# Patient Record
Sex: Male | Born: 1990 | Race: Black or African American | Hispanic: No | Marital: Single | Smoking: Never smoker
Health system: Southern US, Community
[De-identification: ages and names within clinical notes are randomized; demographics above are authoritative.]

## PROBLEM LIST (undated history)

## (undated) DIAGNOSIS — J45909 Unspecified asthma, uncomplicated: Secondary | ICD-10-CM

## (undated) DIAGNOSIS — G4730 Sleep apnea, unspecified: Secondary | ICD-10-CM

## (undated) HISTORY — DX: Unspecified asthma, uncomplicated: J45.909

## (undated) HISTORY — DX: Sleep apnea, unspecified: G47.30

## (undated) HISTORY — PX: TONSILLECTOMY: SUR1361

---

## 2016-06-15 ENCOUNTER — Encounter: Payer: Self-pay | Admitting: Emergency Medicine

## 2016-06-15 ENCOUNTER — Emergency Department
Admission: EM | Admit: 2016-06-15 | Discharge: 2016-06-15 | Disposition: A | Discharge status: Home / Self Care | Payer: Managed Care, Other (non HMO) | Attending: Emergency Medicine | Admitting: Emergency Medicine

## 2016-06-15 DIAGNOSIS — H6092 Unspecified otitis externa, left ear: Secondary | ICD-10-CM | POA: Diagnosis not present

## 2016-06-15 DIAGNOSIS — H9202 Otalgia, left ear: Secondary | ICD-10-CM | POA: Diagnosis present

## 2016-06-15 DIAGNOSIS — H65193 Other acute nonsuppurative otitis media, bilateral: Secondary | ICD-10-CM | POA: Diagnosis not present

## 2016-06-15 DIAGNOSIS — J0100 Acute maxillary sinusitis, unspecified: Secondary | ICD-10-CM | POA: Insufficient documentation

## 2016-06-15 DIAGNOSIS — H65113 Acute and subacute allergic otitis media (mucoid) (sanguinous) (serous), bilateral: Secondary | ICD-10-CM

## 2016-06-15 MED ORDER — NEOMYCIN-POLYMYXIN-HC 3.5-10000-1 OT SOLN: 3.0000 [drp] | Freq: Three times a day (TID) | OTIC | 0 refills | Status: AC

## 2016-06-15 MED ORDER — NEOMYCIN-POLYMYXIN-HC 1 % OT SOLN
4.0000 [drp] | Freq: Once | OTIC | Status: AC
  Administered 2016-06-15: 4 [drp] via OTIC
  Filled 2016-06-15: qty 10

## 2016-06-15 MED ORDER — AMOXICILLIN-POT CLAVULANATE 875-125 MG PO TABS: 1.0000 | ORAL_TABLET | Freq: Two times a day (BID) | ORAL | 0 refills | Status: DC

## 2016-06-15 MED ORDER — FLUTICASONE PROPIONATE 50 MCG/ACT NA SUSP: 1.0000 | Freq: Two times a day (BID) | NASAL | 0 refills | Status: DC

## 2016-06-15 NOTE — ED Provider Notes (Signed)
[facility] Emergency Department Provider Note  ____________________________________________  Time seen: Approximately 10:52 PM  I have reviewed the triage vital signs and the nursing notes.   HISTORY  Chief Complaint Facial Pain and Otalgia    HPI Steve Love is a 25 y.o. male who presents emergency department complaining of left ear pain, and left-sided facial pain. Patient states that he has had swimmers ear symptoms for 9 days. Patient recently moved to the area, has not developed many friends, has been spending time after work relaxing in a [employer] reading a book. Patient reports that he has gotten water in his ears and developed Economist. Patient has had this complaint in the past and states that symptoms were the same. Patient was using over-the-counter swimmer's ear medication, using ear wick for symptom relief. Patient states initially that this was helpful but states that pain has increased over the last several days. Patient is also complaining of facial pressure/pain. Patient states that the symptoms have been ongoing times several days. Patient denies any nasal congestion, visual changes, sore throat, chest pain, shortness of breath, cough, sneezing. No other complaints at this time.   History reviewed. No pertinent past medical history.  There are no active problems to display for this patient.   History reviewed. No pertinent surgical history.  Prior to Admission medications   Medication Sig Start Date End Date Taking? Authorizing Provider  amoxicillin-clavulanate (AUGMENTIN) 875-125 MG tablet Take 1 tablet by mouth 2 (two) times daily. 06/15/16   Delorise Royals Sindi Beckworth, PA-C  fluticasone (FLONASE) 50 MCG/ACT nasal spray Place 1 spray into both nostrils 2 (two) times daily. 06/15/16   Delorise Royals Phares Zaccone, PA-C  neomycin-polymyxin-hydrocortisone (CORTISPORIN) otic solution Place 3 drops into both ears 3 (three) times daily. 06/15/16 06/25/16   Christiane Ha D Traycen Goyer, PA-C    Allergies Review of patient's allergies indicates no known allergies.  History reviewed. No pertinent family history.  Social History Social History  Substance Use Topics  . Smoking status: Never Smoker  . Smokeless tobacco: Never Used  . Alcohol use No     Review of Systems  Constitutional: No fever/chills Eyes: No visual changes. No discharge ENT: Positive for left ear pain. Positive for facial pressure. Cardiovascular: no chest pain. Respiratory: no cough. No SOB. Gastrointestinal: No abdominal pain.  No nausea, no vomiting.  Musculoskeletal: Negative for musculoskeletal pain. Skin: Negative for rash, abrasions, lacerations, ecchymosis. Neurological: Negative for headaches, focal weakness or numbness. 10-point ROS otherwise negative.  ____________________________________________   PHYSICAL EXAM:  VITAL SIGNS: ED Triage Vitals  Enc Vitals Group     BP 06/15/16 2124 (!) 152/82     Pulse Rate 06/15/16 2124 86     Resp 06/15/16 2124 18     Temp 06/15/16 2124 98.6 F (37 C)     Temp Source 06/15/16 2124 Oral     SpO2 06/15/16 2124 97 %     Weight 06/15/16 2125 (!) 360 lb (163.3 kg)     Height 06/15/16 2125 6\' 3"  (1.905 m)     Head Circumference --      Peak Flow --      Pain Score 06/15/16 2125 8     Pain Loc --      Pain Edu? --      Excl. in GC? --      Constitutional: Alert and oriented. Well appearing and in no acute distress. Eyes: Conjunctivae are normal. PERRL. EOMI. Head: Atraumatic. ENT:  Ears: EACs on right is unremarkable. TM is mildly bulging with mucoid air-fluid level. EAC on left is erythematous and edematous, TM is dusky, bulging, air-fluid level identified.      Nose: No congestion/rhinnorhea. Turbinates are boggy. Patient is tender to percussion over left-sided maxillary sinuses.      Mouth/Throat: Mucous membranes are moist.  Neck: No stridor.   Hematological/Lymphatic/Immunilogical: No cervical  lymphadenopathy. Cardiovascular: Normal rate, regular rhythm. Normal S1 and S2.  Good peripheral circulation. Respiratory: Normal respiratory effort without tachypnea or retractions. Lungs CTAB. Good air entry to the bases with no decreased or absent breath sounds. Musculoskeletal: Full range of motion to all extremities. No gross deformities appreciated. Neurologic:  Normal speech and language. No gross focal neurologic deficits are appreciated.  Skin:  Skin is warm, dry and intact. No rash noted. Psychiatric: Mood and affect are normal. Speech and behavior are normal. Patient exhibits appropriate insight and judgement.   ____________________________________________   LABS (all labs ordered are listed, but only abnormal results are displayed)  Labs Reviewed - No data to display ____________________________________________  EKG   ____________________________________________  RADIOLOGY   No results found.  ____________________________________________    PROCEDURES  Procedure(s) performed:    Procedures    Medications  NEOMYCIN-POLYMYXIN-HYDROCORTISONE (CORTISPORIN) otic solution 4 drop (not administered)     ____________________________________________   INITIAL IMPRESSION / ASSESSMENT AND PLAN / ED COURSE  Pertinent labs & imaging results that were available during my care of the patient were reviewed by me and considered in my medical decision making (see chart for details).  Review of the [place] CSRS was performed in accordance of the [facility] prior to dispensing any controlled drugs.  Clinical Course    Patient's diagnosis is consistent with Left-sided otitis externa, bilateral mucoid otitis media, maxillary sinusitis. Patient will be discharged home with prescriptions for antibiotic eardrops, oral antibiotics, Flonase. Patient is to follow up with primary care provider as needed or otherwise directed. Patient is given ED precautions to return to the ED for any  worsening or new symptoms.     ____________________________________________  FINAL CLINICAL IMPRESSION(S) / ED DIAGNOSES  Final diagnoses:  Otitis externa, left  Acute mucoid otitis media of both ears  Acute maxillary sinusitis, recurrence not specified      NEW MEDICATIONS STARTED DURING THIS VISIT:  New Prescriptions   AMOXICILLIN-CLAVULANATE (AUGMENTIN) 875-125 MG TABLET    Take 1 tablet by mouth 2 (two) times daily.   FLUTICASONE (FLONASE) 50 MCG/ACT NASAL SPRAY    Place 1 spray into both nostrils 2 (two) times daily.   NEOMYCIN-POLYMYXIN-HYDROCORTISONE (CORTISPORIN) OTIC SOLUTION    Place 3 drops into both ears 3 (three) times daily.        This chart was dictated using voice recognition software/Dragon. Despite best efforts to proofread, errors can occur which can change the meaning. Any change was purely unintentional.    Racheal PatchesJonathan D Nkechi Linehan, PA-C 06/15/16 2315    Phineas SemenGraydon Goodman, MD 06/15/16 [phone]

## 2016-06-15 NOTE — ED Triage Notes (Signed)
Pt reports pain to left ear spreading into left side of face x 8 days, reports feels like fluid is in ear, hearing muffled.  Pt ambulatory, NAD noted, resp equal and unlabored, skin warm and dry.

## 2016-06-15 NOTE — ED Notes (Signed)
Pt in via triage with complaints of left ear and facial pain.  Pt reports he has had swimmers ear x 7 days, using OTC medicine to "drain ear."  Pt reports pain began today with puss like drainage.  Pt A/Ox4, no immediate distress noted.

## 2018-05-11 ENCOUNTER — Other Ambulatory Visit: Payer: Self-pay | Admitting: General Surgery

## 2018-05-16 ENCOUNTER — Ambulatory Visit
Admission: RE | Admit: 2018-05-16 | Discharge: 2018-05-16 | Disposition: A | Discharge status: Home / Self Care | Payer: Managed Care, Other (non HMO) | Source: Ambulatory Visit | Attending: General Surgery | Admitting: General Surgery

## 2018-05-16 DIAGNOSIS — G4730 Sleep apnea, unspecified: Secondary | ICD-10-CM | POA: Diagnosis present

## 2018-05-16 DIAGNOSIS — Z01810 Encounter for preprocedural cardiovascular examination: Secondary | ICD-10-CM | POA: Insufficient documentation

## 2018-05-31 ENCOUNTER — Encounter: Payer: Managed Care, Other (non HMO) | Attending: General Surgery | Admitting: Skilled Nursing Facility1

## 2018-05-31 ENCOUNTER — Encounter: Payer: Self-pay | Admitting: Skilled Nursing Facility1

## 2018-05-31 DIAGNOSIS — Z713 Dietary counseling and surveillance: Secondary | ICD-10-CM | POA: Diagnosis present

## 2018-05-31 DIAGNOSIS — E669 Obesity, unspecified: Secondary | ICD-10-CM

## 2018-05-31 DIAGNOSIS — Z6841 Body Mass Index (BMI) 40.0 and over, adult: Secondary | ICD-10-CM | POA: Diagnosis not present

## 2018-05-31 NOTE — Progress Notes (Signed)
Pre-Op Assessment Visit:  Pre-Operative Sleeve Surgery  Medical Nutrition Therapy:  Appt start time: 8:03  End time:  9:00  Patient was seen on 05/31/2018 for Pre-Operative Nutrition Assessment. Assessment and [facility] approval [facility] coordinator on 05/31/2018.   According to pts referral: Needs 3 SWL.  Pt states he struggles with emotional eating; Pt states he used to work with a therapist that is how he realized he was an Surveyor, quantityemotional eater and has controlled it with other activities instead of eating. Pt states he takes naps and stays up all night. Pt states he tried keto but realized it was depriving him of vital nutrients. Pt states he does not want to live in [place] and is anxious to move back closer to his very supportive mother.  Current Activity 2-3 times a week walking 2 miles Pt is ready for surgery mentally/emotionally.  Goals: -Sleep 6 hours every night -only 4 meals of fast food a week using your meal ideas sheet  Pt expectation of surgery: to lose weight  Pt expectation of Dietitian: none stated  Start weight at [facility]: 477.3 BMI: 64.72  24 hr Dietary Recall: First Meal: protein shake Snack: Second Meal: fast food Snack: Third Meal: fast food Snack:  Beverages: water, sweet tea, soda  Encouraged to engage in 150 minutes of moderate physical activity including cardiovascular and weight baring weekly  Handouts given during visit include:  . Pre-Op Goals . Bariatric Surgery Protein Shakes During the appointment today the following Pre-Op Goals were reviewed with the patient: . Maintain or lose weight as instructed by your surgeon . Make healthy food choices . Begin to limit portion sizes . Limited concentrated sugars and fried foods . Keep fat/sugar in the single digits per serving on             food labels . Practice CHEWING your food  (aim for 30 chews per bite or until applesauce consistency) . Practice  not drinking 15 minutes before, during, and 30 minutes after each meal/snack . Avoid all carbonated beverages  . Avoid/limit caffeinated beverages  . Avoid all sugar-sweetened beverages . Consume 3 meals per day; eat every 3-5 hours . Make a list of non-food related activities . Aim for 64-100 ounces of FLUID daily  . Aim for at least 60-80 grams of PROTEIN daily . Look for a liquid protein source that contain ?15 g protein and ?5 g carbohydrate  (ex: shakes, drinks, shots)  -Follow diet recommendations listed below   Energy and Macronutrient Recomendations: Calories: 1600 Carbohydrate: 180 Protein: 120 Fat: 44  Demonstrated degree of understanding via:  Teach Back  Teaching Method Utilized:  Visual Auditory Hands on  Barriers to learning/adherence to lifestyle change: none identified   Patient to call the Nutrition and Diabetes Education Services to enroll in Pre-Op and Post-Op Nutrition Education when surgery date is scheduled.

## 2018-06-28 ENCOUNTER — Encounter: Payer: Self-pay | Admitting: Neurology

## 2018-07-03 ENCOUNTER — Encounter: Payer: Self-pay | Admitting: Neurology

## 2018-07-03 ENCOUNTER — Ambulatory Visit: Payer: Managed Care, Other (non HMO) | Admitting: Neurology

## 2018-07-03 ENCOUNTER — Encounter: Payer: Managed Care, Other (non HMO) | Attending: General Surgery | Admitting: Skilled Nursing Facility1

## 2018-07-03 ENCOUNTER — Encounter: Payer: Self-pay | Admitting: Skilled Nursing Facility1

## 2018-07-03 VITALS — BP 157/107 | HR 76 | Ht 74.0 in | Wt >= 6400 oz

## 2018-07-03 DIAGNOSIS — R0682 Tachypnea, not elsewhere classified: Secondary | ICD-10-CM | POA: Diagnosis not present

## 2018-07-03 DIAGNOSIS — Z713 Dietary counseling and surveillance: Secondary | ICD-10-CM | POA: Insufficient documentation

## 2018-07-03 DIAGNOSIS — E662 Morbid (severe) obesity with alveolar hypoventilation: Secondary | ICD-10-CM | POA: Diagnosis not present

## 2018-07-03 DIAGNOSIS — R0602 Shortness of breath: Secondary | ICD-10-CM

## 2018-07-03 DIAGNOSIS — E669 Obesity, unspecified: Secondary | ICD-10-CM | POA: Diagnosis not present

## 2018-07-03 DIAGNOSIS — G4733 Obstructive sleep apnea (adult) (pediatric): Secondary | ICD-10-CM

## 2018-07-03 DIAGNOSIS — Z6841 Body Mass Index (BMI) 40.0 and over, adult: Secondary | ICD-10-CM | POA: Insufficient documentation

## 2018-07-03 DIAGNOSIS — E66813 Obesity, class 3: Secondary | ICD-10-CM | POA: Insufficient documentation

## 2018-07-03 NOTE — Progress Notes (Signed)
Sleeve Assessment:  1st SWL Appointment.  Pt is ready for surgery mentally/emotionally.  Pt arrives having gained about 4 pounds. Pt states he has been drinking more water and is having dreams so sleeping better stating he feels drinking sodas caused him to toss and turn at night. Pt states he has less eczema flare ups since drinking less soda. Pt states he has drank some alcohol to celebrate his birthday.  Pt states he was recognizing that could transition to a problem to he stopped drinking heavily. Pt states he still needs to eat more at home but has made better food choices. Pt states since sleeping better he has more energy and not taking cat naps. Pt states he got veggies to snack on on the way to [place]. Pt states he has been reading more too.   Start weight at [facility]: 477.3 Wt: 481 BMI: 65.24  MEDICATIONS: See List   DIETARY INTAKE:  24-hr recall:  B ( AM): 2-3 eggs with Malaw[place]urkey sausage 2 and 1 slice of wheat toast Snk ( AM):  L ( PM): salad or bunless burger  Snk ( PM):  D ( PM): 3 eggs and waffle or Malaw[place]urkey and cheese sandwich or subway Snk ( PM): sometimes a pecan roll Beverages: water, mixed drinks, coffee, applesauce   Usual physical activity: 2-3 times a week walking 2 miles  Diet to Follow: 2000 calories 225 g carbohydrates 150 g protein 56 g fat   Nutritional Diagnosis:  [place]-3.3 Overweight/obesity related to past poor dietary habits and physical inactivity as evidenced by patient w/ planned sleeve surgery following dietary guidelines for continued weight loss.    Intervention:  Nutrition counseling for upcoming Bariatric Surgery. Goals: -Encouraged to engage in 150 minutes of moderate physical activity including cardiovascular and weight baring weekly -4 days a week for 30 minute of cardio and 30 minutes of weights  -Work on not drinking with meals   Teaching Method Utilized:  Visual Auditory Hands on   Barriers to learning/adherence to lifestyle change:  none stated  Demonstrated degree of understanding via:  Teach Back   Monitoring/Evaluation:  Dietary intake, exercise, and body weight prn.

## 2018-07-03 NOTE — Progress Notes (Addendum)
SLEEP MEDICINE CLINIC   Provider:  Melvyn Novas, M.D.   Primary Care Physician:     Referring Provider: Kinsinger, De Blanch, *   Chief Complaint  Patient presents with  . New Patient (Initial Visit)    pt alone, rm 10. pt is worked up by bariatric surgeon dr. Sheliah Hatch to have gasric sleeve surgery. He has a history of sleep apnea diagnosed 2011/ 2012 inby HST mail ordered to him in [facility] with pulmonology . pt states he was diagnosed with poor ventilation and apnea, started a CPAP machine with [facility] -and stopped the machine roughly 4 years ago. pt snores again and its not as bad as used to.    HPI:  Steve Love is a 27 y.o. male patient with a history of superobeisty- he is seen on 07-03-2018 upon referral  from Dr. Sheliah Hatch for pre op evaluation.   I have the pleasure of meeting with [name] today, a 27 year old African-American gentleman with a history of obstructive sleep apnea, diagnosed in the year 2011 or 2012 in [place], followed by treatment with a CPAP machine.  He moved to [facility] 2017.  At the time he had not used his CPAP machine probably for 2 to 3 years.  He did acknowledge that he felt he was sleeping better, and thought he would no longer needed as much.  But he is snoring again. Steve Love report that his mother also has obstructive sleep apnea and is treated with CPAP.   Chief complaint according to patient : The patient is working with a nutritionist in preparation for his bariatric surgery.  He reports that some mornings he will not go to bed before 3 or 4 AM, but he does not feel significantly sleepy or fatigued.  Sleep habits are as follows: The patient may return from work as early as 5 and as late as 8 PM, dinnertime is usually around an hour after returning home.  His bedtime also may vary but is often after midnight.  He keeps his bedroom cool, quiet and dark.  He prefers to sleep on his sides, on a lot of pillows.  It is more  comfortable when the chest and head are elevated.  He reports that he dreams frequently.  Does not have nocturia but usually has to go at 6 AM.  He is usually able to sleep 5 to 6 hours through the night..  Some mornings he will feel a little sleepy but in general he has no trouble getting up in the morning, there is no dizziness or lightheadedness, palpitations or diaphoresis, headaches and no dry mouth. He has nobody witnessing his sleep, snoring  Or apnea.  He takes naps 3-4 hours long- these interfere with his sleep at night- he would go to bed right after work and wake up 4 hours later , then couldn't sleep through the night.   Sleep medical history and family sleep history:  mother has OSA, twin siblings do not.  Super obesity - after 16 , changes in activity level, eating fast food, high carb.   Social history:  Soda and sweet iced tea drinker, coffee , too.  ETOH - social - at parties.  Tobacco non sure.   Review of Systems: Out of a complete 14 system review, the patient complains of only the following symptoms, and all other reviewed systems are negative.   Epworth score 6/ 24  , Fatigue severity score 15/ 63   , depression score :  Social History   Socioeconomic History  . Marital status: Single    Spouse name: Not on file  . Number of children: Not on file  . Years of education: Not on file  . Highest education level: Not on file  Occupational History  . Not on file  Social Needs  . Financial resource strain: Not on file  . Food insecurity:    Worry: Not on file    Inability: Not on file  . Transportation needs:    Medical: Not on file    Non-medical: Not on file  Tobacco Use  . Smoking status: Never Smoker  . Smokeless tobacco: Never Used  Substance and Sexual Activity  . Alcohol use: Never    Frequency: Never  . Drug use: No  . Sexual activity: Not on file  Lifestyle  . Physical activity:    Days per week: Not on file    Minutes per session: Not on file    . Stress: Not on file  Relationships  . Social connections:    Talks on phone: Not on file    Gets together: Not on file    Attends religious service: Not on file    Active member of club or organization: Not on file    Attends meetings of clubs or organizations: Not on file    Relationship status: Not on file  . Intimate partner violence:    Fear of current or ex partner: Not on file    Emotionally abused: Not on file    Physically abused: Not on file    Forced sexual activity: Not on file  Other Topics Concern  . Not on file  Social History Narrative  . Not on file    Family History  Problem Relation Age of Onset  . Cancer Other   . Hypertension Mother   . Prostate cancer Father     Past Medical History:  Diagnosis Date  . Asthma   . Sleep apnea     Past Surgical History:  Procedure Laterality Date  . TONSILLECTOMY      No current outpatient medications on file.   No current facility-administered medications for this visit.     Allergies as of 07/03/2018  . (No Known Allergies)    Vitals: BP (!) 157/107   Pulse 76   Ht 6\' 2"  (1.88 m)   Wt (!) 485 lb (220 kg)   BMI 62.27 kg/m  Last Weight:  Wt Readings from Last 1 Encounters:  07/03/18 (!) 485 lb (220 kg)   UJW:JXBJBMI:Body mass index is 62.27 kg/m.     Last Height:   Ht Readings from Last 1 Encounters:  07/03/18 6\' 2"  (1.88 m)  respiratory rate is 19/min at rest.   Physical exam: SOB without exertion,   General: The patient is awake, alert and appears not in acute distress. The patient is well groomed. Head: Normocephalic, atraumatic. Neck is supple. Mallampati 5- invisible uvula ,   neck circumference:21.25" . Nasal airflow patent , Retrognathia is seen.  Cardiovascular:  Regular rate and rhythm , without  murmurs or carotid bruit, and without distended neck veins. Respiratory: Lungs are clear to auscultation. Skin:  Without evidence of edema, or rash Trunk: BMI is 62.27. The patient's posture is  erect.  Neurologic exam : The patient is drowsy, oriented to place and time.   Memory subjective described as intact. Attention span & concentration ability appears normal.  Speech is fluent,  without dysarthria, dysphonia or aphasia.  Mood and affect are appropriate.  Cranial nerves: Pupils are equal and briskly reactive to light.  Extraocular movements  in vertical and horizontal planes intact and without nystagmus. Visual Pelly by finger perimetry are intact.Hearing to finger rub intact. Facial sensation intact to fine touch.Facial motor strength is symmetric and tongue moves in  midline. Shoulder shrug was symmetrical.   Motor exam:  Normal tone, muscle bulk and symmetric strength in all extremities. Sensory:  Fine touch, pinprick and vibration were normal. Coordination: Rapid alternating movements normal without evidence of ataxia, dysmetria or tremor. Gait and station: Patient walks without assistive device and wide based . Deep tendon reflexes: in the  upper and lower extremities are symmetric and intact. Babinski maneuver response is downgoing.  Assessment:  After physical and neurologic examination, review of laboratory studies,  Personal review of imaging studies, reports of other /same  Imaging studies, results of polysomnography and / or neurophysiology testing and pre-existing records as far as provided in visit., my assessment is   1) Mr. 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    It is remarkable that he does not wake up with headaches, or a dry mouth.  He is not excessively daytime sleepy or fatigued.  Since he has already been diagnosed with obstructive sleep apnea in the past and was treated with CPAP I feel that it is enough to read demonstrate that apnea is still present and  then begin therapy.  I would prefer an attended sleep study split-night polysomnography during the baseline study of this protocol I would like to have capnography obtained.  If this is not possible or if Marily Memos does not approve of an attended sleep study we will have to resume using a home sleep test. I will order both tests today.  I will ask the sleep lab manager to make sure that if an attended sleep study is permitted, capnography will be used on this young man . If he is prolonged hypoxemic we may also titrate to oxygen.  I wish him the best of luck for his upcoming weight loss surgery.   I will send a copy of this report to Dr. Sheliah Hatch.  The patient was advised of the nature of the diagnosed disorder , the treatment options and the  risks for general health and wellness arising from not treating the condition.   I spent more than 45 minutes of face to face time with the patient.  Greater than 50% of time was spent in counseling and coordination of care. We have discussed the diagnosis and differential and I answered the patient's questions.    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  Needs capnography, may be oxygen titration.   SPLIT , Co2 , o2 and wedge for patient .    Melvyn Novas, MD 07/03/2018, 11:40 AM  Certified in Neurology by ABPN Certified in Sleep Medicine by [facility] [address], [address] [place], [place] [address]

## 2018-07-16 ENCOUNTER — Telehealth: Payer: Self-pay

## 2018-07-16 DIAGNOSIS — R0602 Shortness of breath: Secondary | ICD-10-CM

## 2018-07-16 DIAGNOSIS — Z6841 Body Mass Index (BMI) 40.0 and over, adult: Principal | ICD-10-CM

## 2018-07-16 DIAGNOSIS — E662 Morbid (severe) obesity with alveolar hypoventilation: Secondary | ICD-10-CM

## 2018-07-16 DIAGNOSIS — G4733 Obstructive sleep apnea (adult) (pediatric): Secondary | ICD-10-CM

## 2018-07-16 DIAGNOSIS — R0682 Tachypnea, not elsewhere classified: Secondary | ICD-10-CM

## 2018-07-16 NOTE — Telephone Encounter (Signed)
Insurance has denied the in lab sleep study request. Do you want to order a HST?

## 2018-07-17 NOTE — Telephone Encounter (Signed)
insurance related change to HST.

## 2018-07-17 NOTE — Addendum Note (Signed)
Addended by: Melvyn Novas on: 07/17/2018 05:13 PM   Modules accepted: Orders

## 2018-08-02 ENCOUNTER — Encounter: Payer: Self-pay | Admitting: Skilled Nursing Facility1

## 2018-08-02 ENCOUNTER — Encounter: Payer: Managed Care, Other (non HMO) | Attending: General Surgery | Admitting: Skilled Nursing Facility1

## 2018-08-02 DIAGNOSIS — Z6841 Body Mass Index (BMI) 40.0 and over, adult: Secondary | ICD-10-CM | POA: Insufficient documentation

## 2018-08-02 DIAGNOSIS — Z713 Dietary counseling and surveillance: Secondary | ICD-10-CM | POA: Diagnosis present

## 2018-08-02 DIAGNOSIS — E662 Morbid (severe) obesity with alveolar hypoventilation: Secondary | ICD-10-CM

## 2018-08-02 NOTE — Progress Notes (Signed)
Sleeve Assessment: 2nd SWL Appointment.  Pt is ready for surgery mentally/emotionally.  Pt arrives having lost about 1 pound. Pt states he has to find something that curves his appetite stating he is snacking all the time. Pt states he has been good about not over eating. Pt states he is trying to get a C-PAP machine.    Start weight at [facility]: 477.3 Wt: 480 BMI: 65.21  MEDICATIONS: See List   DIETARY INTAKE:  24-hr recall:  B ( AM): cereal or poptart  Snk ( AM): granola bar or protein shake L ( PM): 3 chicken legs and broccoli sometimes with mac n cheese Snk ( PM): fruit D ( PM): chicken legs and vegetables  Snk ( PM): protein shake  Beverages: water, green tea, protein shakes  Usual physical activity: 4-5 days a Week cardio and weight lifting   Diet to Follow: 2000 calories 225 g carbohydrates 150 g protein 56 g fat   Nutritional Diagnosis:  [place]-3.3 Overweight/obesity related to past poor dietary habits and physical inactivity as evidenced by patient w/ planned sleeve surgery following dietary guidelines for continued weight loss.    Intervention:  Nutrition counseling for upcoming Bariatric Surgery. Goals: -Encouraged to engage in 150 minutes of moderate physical activity including cardiovascular and weight baring weekly -Work on making balanced breakfasts with protein -Find the stress leading to the snacking and then deal with that stress more appropriately like walking -Continue to Work on not drinking with meals   Teaching Method Utilized:  Visual Auditory Hands on   Barriers to learning/adherence to lifestyle change: none stated  Demonstrated degree of understanding via:  Teach Back   Monitoring/Evaluation:  Dietary intake, exercise, and body weight prn.

## 2018-08-29 ENCOUNTER — Ambulatory Visit (INDEPENDENT_AMBULATORY_CARE_PROVIDER_SITE_OTHER): Payer: 59 | Admitting: Neurology

## 2018-08-29 DIAGNOSIS — R0602 Shortness of breath: Secondary | ICD-10-CM

## 2018-08-29 DIAGNOSIS — E66813 Obesity, class 3: Secondary | ICD-10-CM

## 2018-08-29 DIAGNOSIS — G4733 Obstructive sleep apnea (adult) (pediatric): Secondary | ICD-10-CM | POA: Diagnosis not present

## 2018-08-29 DIAGNOSIS — R0682 Tachypnea, not elsewhere classified: Secondary | ICD-10-CM

## 2018-08-29 DIAGNOSIS — Z6841 Body Mass Index (BMI) 40.0 and over, adult: Principal | ICD-10-CM

## 2018-08-29 DIAGNOSIS — E662 Morbid (severe) obesity with alveolar hypoventilation: Secondary | ICD-10-CM

## 2018-09-03 ENCOUNTER — Encounter: Payer: 59 | Attending: General Surgery | Admitting: Skilled Nursing Facility1

## 2018-09-03 ENCOUNTER — Encounter: Payer: Self-pay | Admitting: Skilled Nursing Facility1

## 2018-09-03 DIAGNOSIS — Z6841 Body Mass Index (BMI) 40.0 and over, adult: Secondary | ICD-10-CM | POA: Insufficient documentation

## 2018-09-03 DIAGNOSIS — E662 Morbid (severe) obesity with alveolar hypoventilation: Secondary | ICD-10-CM

## 2018-09-03 DIAGNOSIS — Z713 Dietary counseling and surveillance: Secondary | ICD-10-CM | POA: Diagnosis not present

## 2018-09-03 NOTE — Progress Notes (Signed)
Sleeve Assessment: 3rd SWL Appointment.  Pt is ready for surgery mentally/emotionally.   Pt arrives having gained about 3 pounds. Pt states he has noticed with not drinking with meals he is getting fuller faster. Pt states he dd eat out more this month because of stress at work. Pt states he is working on preparing his meals for the upcoming holiday season. Pt states he has been going to the gym more often. Pt states he has done better with recognizing he is emotionally eating. Pt states his urine stays clear and his skin feels softer since cutting back on sugar. Pt states she has only had 1 soda a day. Pt states since drinking more water and working out more he no longer has back spasms.   Start weight at [facility]: 477.3 Wt: 483 BMI: 65.59  MEDICATIONS: See List   DIETARY INTAKE:  24-hr recall:  B ( AM): egg and toast Snk ( AM): granola bar or protein shake L ( PM): [place] sandwich or peanut butter jelly sandwich Snk ( PM): fruit D ( PM): grilled chicken sandwich with mac n cheese or side salad  Snk ( PM): cereal or protein shakes with pureed frosted flakes  Beverages: water, green tea, protein shakes  Usual physical activity: 4-5 days a Week cardio and weight lifting   Diet to Follow: 2000 calories 225 g carbohydrates 150 g protein 56 g fat   Nutritional Diagnosis:  [place]-3.3 Overweight/obesity related to past poor dietary habits and physical inactivity as evidenced by patient w/ planned sleeve surgery following dietary guidelines for continued weight loss.    Intervention:  Nutrition counseling for upcoming Bariatric Surgery. Goals: -Encouraged to engage in 150 minutes of moderate physical activity including cardiovascular and weight baring weekly -Work on making balanced breakfasts with protein -Find the stress leading to the snacking and then deal with that stress more appropriately like walking -Continue to Work on not drinking with meals  -prepare your meals for the holiday  rush  Teaching Method Utilized:  Visual Auditory Hands on   Barriers to learning/adherence to lifestyle change: none stated  Demonstrated degree of understanding via:  Teach Back   Monitoring/Evaluation:  Dietary intake, exercise, and body weight prn.

## 2018-09-03 NOTE — Progress Notes (Signed)
[id number] 

## 2018-09-07 NOTE — Procedures (Signed)
NAME:   Steve Love                                                                   DOB: 1991/06/01 MEDICAL RECORD No:  [id number]                                           DOS:  08/30/2018 REFERRING PHYSICIAN: Rodman PickleLuke Aaron Kinsinger, MD STUDY PERFORMED: Home Sleep Test on Apnea Link HISTORY: Steve Love is a 27 y.o. male patient with a history of super-obesity- and is seen on 07-03-2018 upon referral from Dr. Sheliah HatchKinsinger for pre op evaluation.   I have the pleasure of meeting with Steve Love today, a 27 year old African-American gentleman with a history of obstructive sleep apnea, diagnosed in the year 2011 or 2012 in [place], followed by treatment with a CPAP machine.  [place] in [place] had no longer used his CPAP machine probably for 2 to 3 years.  He did acknowledge that he felt he was sleeping better, and thought he would no longer needed as much sleep -but now he is snoring again. He reports that some mornings he will not go to bed before 3 or 4 AM, but he does not feel significantly sleepy or fatigued. Orthopnea . Epworth Sleepiness score endorsed at 6/ 24 points, the Fatigue severity score at 15/ 63 points , BMI is 62.2 kg/m2.      STUDY RESULTS:  Total Recording Time: 6 h 52 min, valid flow evaluation: 6 h 11min Total Apnea/Hypopnea Index (AHI):  7.6 /h; RDI: 11.7 /h Average Oxygen Saturation:   94 %; Lowest Oxygen Saturation: 82 %.  Total Time Oxygen Saturation below 89 %: 6.0 minutes.  Average Heart Rate:  80 bpm (between 64 and 126 bpm). IMPRESSION: Surprisingly very mild Obstructive Sleep Apnea (AHI of only 7.6/h)  by Apnea Link. His desaturation index was higher, at 13.4/h and is more believable to reflect apneas and hypopneas correctly. No prolonged oxygen desaturation was noted in this patient with orthopnea. Moderate- loud snoring is noted.  RECOMMENDATION:  CPAP or a dental device can be used for mild sleep apnea, however at the  current BMI there is potentially retention of Co2 and shallow breathing with hypoventilation present, which is not easily differentiated by this HST device. I would recommend an attended CPAP treatment with capnography to evaluate hypoventilation.  An autotitration device will then adjust pressures accordingly to changes in BMI.   I certify that I have reviewed the raw data recording prior to the issuance of this report in accordance with the standards of the American Academy of Sleep Medicine (AASM). Melvyn Novasarmen Tashyra Adduci, M.D.   09-06-2018    Medical Director of [facility], accredited by the AASM. Diplomat of the ABPN and ABSM.

## 2018-09-07 NOTE — Addendum Note (Signed)
Addended by: Melvyn NovasHMEIER, Zaydan Papesh on: 09/07/2018 11:32 AM   Modules accepted: Orders

## 2018-09-10 ENCOUNTER — Telehealth: Payer: Self-pay | Admitting: Neurology

## 2018-09-10 NOTE — Telephone Encounter (Signed)
-----   Message from Melvyn Novasarmen Dohmeier, MD sent at 09/07/2018 11:32 AM EST ----- IMPRESSION: Surprisingly very mild Obstructive Sleep Apnea (AHI  of only 7.6/h) by Apnea Link. His desaturation index was higher,  at 13.4/h and is more believable to reflect apneas and hypopneas  correctly. No prolonged oxygen desaturation was noted in this patient with orthopnea. Moderate- loud snoring is noted.  RECOMMENDATION: CPAP or a dental device can be used for mild sleep apnea, however at the current BMI there is potentially  retention of Co2 and shallow breathing with hypoventilation present, which is not easily differentiated by this HST device. I  would recommend an attended CPAP treatment with capnography to evaluate hypoventilation. An autotitration device will then adjust pressures accordingly to changes in BMI.

## 2018-09-10 NOTE — Telephone Encounter (Signed)
Called the patient and advised the patient that the sleep study was read by Dr Vickey Hugerohmeier and found that he had mild sleep apnea. Dr Vickey Hugerohmeier isnt quite sure the accuracy of the test and she is wanting to bring the patient in so that we can monitor CO2 levels.  Dr. Vickey Hugerohmeier recommends that pt return for a repeat sleep study in order to properly titrate the cpap and ensure a good mask fit. Pt is agreeable to returning for a titration study. I advised pt that our sleep lab will file with pt's insurance and call pt to schedule the sleep study when we hear back from the pt's insurance regarding coverage of this sleep study. Pt verbalized understanding of results. Pt had no questions at this time but was encouraged to call back if questions arise.

## 2018-10-01 ENCOUNTER — Encounter: Payer: 59 | Attending: General Surgery | Admitting: Skilled Nursing Facility1

## 2018-10-01 ENCOUNTER — Encounter: Payer: Self-pay | Admitting: Skilled Nursing Facility1

## 2018-10-01 DIAGNOSIS — Z713 Dietary counseling and surveillance: Secondary | ICD-10-CM | POA: Insufficient documentation

## 2018-10-01 DIAGNOSIS — E662 Morbid (severe) obesity with alveolar hypoventilation: Secondary | ICD-10-CM

## 2018-10-01 DIAGNOSIS — Z6841 Body Mass Index (BMI) 40.0 and over, adult: Secondary | ICD-10-CM | POA: Insufficient documentation

## 2018-10-01 NOTE — Progress Notes (Signed)
Pre-Operative Nutrition Class:  Appt start time: 5051   End time:  1830.  Patient was seen on 10/01/2018 for Pre-Operative Bariatric Surgery Education at [facility].   Surgery date:  Surgery type: sleeve Start weight at [facility]: 477.3 Weight today: 488.1  Samples given per MNT protocol. Patient educated on appropriate usage: Bariatric Advantage Multivitamin Lot #  [id number] Exp: 4/21  Bariatric Advantage Calcium  Lot # [id number] Exp: 05/11/2019  Renee Pain Protein  Shake Lot # [id number] Exp: 04/25/19  Bariatric advantage: Lot: [id number]: 03/21  Protein 20: Lot: [id number]: 11/11/19   The following the learning objectives were met by the patient during this course:  Identify Pre-Op Dietary Goals and will begin 2 weeks pre-operatively  Identify appropriate sources of fluids and proteins   State protein recommendations and appropriate sources pre and post-operatively  Identify Post-Operative Dietary Goals and will follow for 2 weeks post-operatively  Identify appropriate multivitamin and calcium sources  Describe the need for physical activity post-operatively and will follow MD recommendations  State when to call healthcare provider regarding medication questions or post-operative complications  Handouts given during class include:  Pre-Op Bariatric Surgery Diet Handout  Protein Shake Handout  Post-Op Bariatric Surgery Nutrition Handout  BELT Program Information Flyer  Support Group Information Flyer  [facility] Bariatric Supplements Price List  Follow[facility]: [facility] per MD.

## 2018-10-02 ENCOUNTER — Telehealth: Payer: Self-pay | Admitting: Skilled Nursing Facility1

## 2018-10-02 NOTE — Telephone Encounter (Signed)
Dietitian called pt to let him know Lorene DyChristie with [facility] states he needs to get back to his original weight and show consistency with his weight for his insurance company to cover his surgery.   Pt states he will call back to make an appt and try to lose weight in the mean time.

## 2018-10-05 NOTE — Telephone Encounter (Signed)
Pt will call [facility] to set up an appt.

## 2019-08-22 ENCOUNTER — Other Ambulatory Visit: Payer: Self-pay

## 2019-08-22 ENCOUNTER — Encounter: Payer: 59 | Attending: General Surgery | Admitting: Dietician

## 2019-08-22 ENCOUNTER — Encounter: Payer: Self-pay | Admitting: Dietician

## 2019-08-22 VITALS — Ht 72.0 in | Wt >= 6400 oz

## 2019-08-22 DIAGNOSIS — J45909 Unspecified asthma, uncomplicated: Secondary | ICD-10-CM | POA: Insufficient documentation

## 2019-08-22 DIAGNOSIS — Z6841 Body Mass Index (BMI) 40.0 and over, adult: Secondary | ICD-10-CM | POA: Insufficient documentation

## 2019-08-22 DIAGNOSIS — Z713 Dietary counseling and surveillance: Secondary | ICD-10-CM | POA: Insufficient documentation

## 2019-08-22 DIAGNOSIS — E66813 Obesity, class 3: Secondary | ICD-10-CM

## 2019-08-22 DIAGNOSIS — G4730 Sleep apnea, unspecified: Secondary | ICD-10-CM | POA: Diagnosis not present

## 2019-08-22 NOTE — Patient Instructions (Signed)
   Plan time to shop for healthy food options to prepare and eat more meals at home.   Great job making healthy diet changes! Keep up the awesome work!

## 2019-08-22 NOTE — Progress Notes (Signed)
Nutrition Assessment/ Weight management     Height: 6'2" Weight: 465.2lbs BMI: 63.09 Upper IBW% (UIBW): 222% (209lbs)  Patient's Goal Weight: <350lbs  Medical History: asthma, sleep apnea Medications and Supplements: no medications at this time  Previous surgeries: tonsillectomy Drug allergies: none known Food allergies: none known Alcohol use: none currently  Tobacco use: never  Physical activity: walking 25 minutes 2-3x a week  Dieting/ weight loss history:   Patient reports making diet and lifestyle changes in recent months to promote weight loss; he has lost about 25lbs in the past year.   He has been eating fewer restaurant meals and making healthier choices at restaurants; has significantly reduced intake of sugar-sweetened beverages. He has increased some physical activity.   He reports his highest weight was about 540lbs several years ago; meeting with therapist to get control over emotional eating (binge eating) helped him begin losing weight.  Family and most friends live 2-3 hours' drive away but do provide positive support for patient.   Dietary Recall:  Daily pattern: 2-3 meals and 1 snacks. Dining out: multiple meals per week. Breakfast: often none due to sleeping late; coffee, sometimes peanut butter jelly sandwich Lunch: sub sandwich; [employer] restaurant food (close to work) Supper: salad with grilled meat; chick-fila Snack(s): PBJ or [place] sandwich if hungry (used to eat fast food) Beverages: water, juice (planning to decrease), coffee, soda 3-4 per week (down from 3-4 per day)   Intervention:  Reviewed progress since previous visit on 10/01/18.  Instructed him on pre-op diet guidelines, including pre-op/ liver reduction diet.   Discussed stages of the bariatric diet after surgery as well as the importance of adequate protein and fluid intake, importance of vitamin supplementation after surgery.   Discussed surgery as a tool for making lifestyle changes  and importance of permanent habit change.   Established nutrition goals for this week with direction from patient.   Summary:  Patient has made a concerted effort to reduce caloric intake to lose weight and prepare for bariatric surgery.  He has solid support from family and friends, some of whom have had weight loss surgery.   He agrees to work on further reduction in [employer] and sugary beverages prior to surgery.   He is motivated to follow the bariatric diet after surgery.   Plan:  Patient will return in 1 week for next weight management visit.

## 2019-08-29 ENCOUNTER — Other Ambulatory Visit: Payer: Self-pay

## 2019-08-29 ENCOUNTER — Encounter: Payer: 59 | Admitting: Dietician

## 2019-08-29 VITALS — Ht 72.0 in | Wt >= 6400 oz

## 2019-08-29 DIAGNOSIS — Z713 Dietary counseling and surveillance: Secondary | ICD-10-CM | POA: Diagnosis not present

## 2019-08-29 DIAGNOSIS — Z6841 Body Mass Index (BMI) 40.0 and over, adult: Secondary | ICD-10-CM

## 2019-08-29 NOTE — Progress Notes (Signed)
Appt start time: 1310 end time:  1340.  Assessment:   #7 SWL Appointment.   Start Wt at [place]: 477.2lbs on 05/31/18 Wt: 463.4lbs Ht: 6'0" BMI: 62.85   Learning Readiness:   Change in progress  MEDICATIONS: none taken at this time  Progress:  Patient has been reducing red meat intake in the past weeks, as he has noticed his breathing is better when eating less meat-- especially beef and fried meats.   He has worked to increase vegetables, whole grains, and to reduce the number of restaurant meals.   He is gradually increasing physical activity.  Dietary intake: 2-3 meals + 2-4 snacks daily Breakfast: occasionally egg and toast and coffee, sometimes skips due to sleeping late  Snack: same as pm  Lunch: bringing leftovers from home ie grilled chicken then salad with leftover chicken or chicken sandwich (gets veggie sub from rest then adds chicken Snack: celery, carrots, or granola bar Dinner: grilled chicken with whole wheat pasta, increased low-carb vegetables.  Snack: same as pm Beverages: water, alkaline water  Usual physical activity: walking 45 minutes 3x a week  Diet to Follow: Continue with current eating pattern, gradually reducing carbohydrate sources.                Nutritional Diagnosis:  [place]-3.3 Overweight/obesity related to history of excess calories and physical inactivity as evidenced by patient current BMI of 62.85, following dietary guidelines for continued weight loss prior to bariatric surgery.              Intervention:   . Nutrition counseling for weight loss prior to upcoming bariatric surgery.  Teaching Method Utilized:  Visual Auditory  Handouts given during visit include:  Goals and Instructions  Barriers to learning/adherence to lifestyle change: none  Demonstrated degree of understanding via:  Teach Back   Monitoring/Evaluation:  Dietary intake, exercise, and body weight 09/02/19 at 10:30am.

## 2019-08-29 NOTE — Patient Instructions (Signed)
   Focus on planning ahead for balanced meals and healthy snacks.   Try using a tray or cookie sheet, line with parchment paper, and freeze individual portions of meat, vegetables, or fruit. When frozen, you can put the pieces in a baggie and they won't stick together, so you can remove one portion at a time.   Great job making healthy changes! Keep up the awesome work!

## 2019-09-02 ENCOUNTER — Encounter: Payer: 59 | Admitting: Dietician

## 2019-09-02 ENCOUNTER — Other Ambulatory Visit: Payer: Self-pay

## 2019-09-02 VITALS — Ht 74.0 in | Wt >= 6400 oz

## 2019-09-02 NOTE — Patient Instructions (Signed)
   Continue with current eating pattern, gradually reducing portions of starchy foods such as rice, pasta, cereals, bread.   Great job including whole grain foods along with healthy proteins and vegetables!  Continue to include physical activity on a regular basis.

## 2019-09-02 NOTE — Progress Notes (Signed)
Appt start time: 1030 end time:  1045.  Assessment:   #8 SWL Appointment.   Start Wt at [facility]: 46.2lbs Wt: 462lbs, self-reported Ht: 6'2" (previously recorded as 6'0") BMI: 59.32   Learning Readiness:   Change in progress  MEDICATIONS: none taken at this time  Progress:  Patient continues to work on dietary improvements; he is avoiding processed starches and choosing whole grains along with lean proteins and vegetables.   He feels like his energy level is improving with weight loss and increase in activity.   Dietary intake:  Breakfast: trying to eat oatmeal (whole grain) rather than toast  Snack: celery, carrots, or granola bar  Lunch: grilled chicken and brown rice + broccoli, pinto beans Snack: same as am Dinner: 11/15 yogurt parfait Snack: same as am Beverages: water, alkaline water  Usual physical activity: walked 1 mile 11/15  Diet to Follow: Continue with current eating pattern; gradually reduce portions of starchy foods/ carbs               Nutritional Diagnosis:  [place]-3.3 Overweight/obesity related to history of excess calories and physical inactivity as evidenced by patient current BMI of 59.32, following dietary guidelines for continued weight loss prior to bariatric surgery.              Intervention:   . Nutrition counseling for weight loss prior to upcoming bariatric surgery. . Commended patient for ongoing efforts with positive lifestyle change.   Teaching Method Utilized:  Auditory   Barriers to learning/adherence to lifestyle change: none  Demonstrated degree of understanding via:  Teach Back   Monitoring/Evaluation:  Dietary intake, exercise, and body weight 09/05/19 at 1:15pm.

## 2019-09-05 ENCOUNTER — Ambulatory Visit: Payer: 59 | Admitting: Dietician

## 2019-09-06 ENCOUNTER — Encounter: Payer: Self-pay | Admitting: Dietician

## 2019-09-06 ENCOUNTER — Encounter: Payer: 59 | Admitting: Dietician

## 2019-09-06 ENCOUNTER — Other Ambulatory Visit: Payer: Self-pay

## 2019-09-06 VITALS — Ht 75.0 in | Wt >= 6400 oz

## 2019-09-06 DIAGNOSIS — Z713 Dietary counseling and surveillance: Secondary | ICD-10-CM | POA: Diagnosis not present

## 2019-09-06 NOTE — Progress Notes (Signed)
Appt start time: 1300 end time:  1330.  Assessment:   #9 SWL Appointment.   Start Wt at [facility]: 62.2lbs Wt: 466.6lbs Ht: 6'2" BMI: 58.32   Learning Readiness:   Change in progress  MEDICATIONS: none taken at this time  Progress:  Patient has decided he will be having sleeve gastrectomy surgery  He has not eaten restaurant food in the past 3 days. Cooked [place] meatballs and sausage  His goal is to avoid overbuying groceries, and guagint/ spacing intake to make food last without waste   Dietary intake:  Breakfast: cereal with almond milk; [place] sausage patties Snack: granola bar  Lunch: [place] sausage dogs on whole grain bread; [place] sausage on salad; chips and queso Snack: granola bar or meatballs Dinner: [place] sausage as lunch Snack: none or same as pm Beverages: water, alkaline water  Usual physical activity: [employer] 11/17 -- treadmill  Diet to Follow: Small portions carbohydrates lean protein Unlimited low-carb vegetables              Nutritional Diagnosis:  [place]-3.3 Overweight/obesity related to history of excess calories and physical inactivity as evidenced by patient current BMI of 58.32, following dietary guidelines for continued weight loss prior to bariatric surgery.              Intervention:   . Nutrition counseling for weight loss prior to upcoming bariatric surgery.  Teaching Method Utilized:  Visual Auditory Hands on  Handouts given during visit include:  Goals and instructions   Barriers to learning/adherence to lifestyle change: none  Demonstrated degree of understanding via:  Teach Back   Monitoring/Evaluation:  Dietary intake, exercise, and body weight 05/09/19 at 1:15pm.

## 2019-09-06 NOTE — Patient Instructions (Signed)
   Continue with lean protein foods and plenty of low-carb veggies.   Keep portions of starchy foods small -- bread, pasta, rice, cereal, potatoes, etc.  Continue to increase exercise, the more days each week the better.

## 2019-09-09 ENCOUNTER — Encounter: Payer: 59 | Admitting: Dietician

## 2019-09-09 ENCOUNTER — Other Ambulatory Visit: Payer: Self-pay

## 2019-09-09 ENCOUNTER — Encounter: Payer: Self-pay | Admitting: Dietician

## 2019-09-09 DIAGNOSIS — Z6841 Body Mass Index (BMI) 40.0 and over, adult: Secondary | ICD-10-CM

## 2019-09-09 DIAGNOSIS — Z713 Dietary counseling and surveillance: Secondary | ICD-10-CM | POA: Diagnosis not present

## 2019-09-09 NOTE — Progress Notes (Signed)
Appt start time: 1310 end time:  1330.  Assessment:   #10 SWL Appointment.   Start Wt at [facility]: 477.2lbs Wt: 467.1 lbs Ht: 6'2" BMI: 59.97   Learning Readiness:   Change in progress  MEDICATIONS: none taken at this time  Progress:  Patient reports maintaining intake of lean protein foods and continues to increase low-carb vegetables.   He has now stopped having chips when he eats a sandwich.   He is working to increase physical activity.  Dietary intake: [place] meatballs and sausage + broccoli, ate some bananas over the weekend. Ate some whole grain bread but no chips Breakfast: cereal with almond milk; [place] sausage  Snack: granola bar  Lunch: sandwich no chips; [place] or chicken + veg. Snack: granola bar Dinner: [place] Hydrographic surveyor: none or granola bar Beverages: water, alkaline water  Usual physical activity: treadmill 60 minutes once in past 3 days  Diet to Follow: Small portions carbohydrates lean protein Unlimited low-carb vegetables              Nutritional Diagnosis:  [place]-3.3 Overweight/obesity related to history of excess calories and physical inactivity as evidenced by patient current BMI of 59.97, following dietary guidelines for continued weight loss prior to bariatric surgery.              Intervention:   . Nutrition counseling for weight loss prior to upcoming bariatric surgery.  Teaching Method Utilized:  Visual Auditory Hands on   Barriers to learning/adherence to lifestyle change: none  Demonstrated degree of understanding via:  Teach Back   Monitoring/Evaluation:  Dietary intake, exercise, and body weight 09/16/19.

## 2019-09-16 ENCOUNTER — Encounter: Payer: Self-pay | Admitting: Dietician

## 2019-09-16 ENCOUNTER — Encounter: Payer: 59 | Admitting: Dietician

## 2019-09-16 ENCOUNTER — Other Ambulatory Visit: Payer: Self-pay

## 2019-09-16 DIAGNOSIS — Z713 Dietary counseling and surveillance: Secondary | ICD-10-CM | POA: Diagnosis not present

## 2019-09-16 DIAGNOSIS — Z6841 Body Mass Index (BMI) 40.0 and over, adult: Secondary | ICD-10-CM

## 2019-09-16 NOTE — Progress Notes (Signed)
Appt start time: 1630 end time:  1650.  Assessment:   #11 SWL/pre-op Appointment.   Start Wt at [facility]: 477.2lbs Wt: 470.0lbs Ht: 6'2" BMI: 60.34   Learning Readiness:   Change in progress  MEDICATIONS: none taken at this time  Progress:  Patient unsure of cause of weight increase, other than some extra eating on Thanksgiving, but he does not feel he overate by a significant amount.   He reports working to increase exercise over the past week.     Dietary intake:  Breakfast: cereal; toast (no sausage recently)  Snack: granola bar  Lunch: sandwich; salad Snack: none Dinner: chicken or shrimp + veg brocc, carrots, salad with spinach, cucumber, tomato Snack: none Beverages: water, alkaline water  Usual physical activity: walking 30-45 minutes 4 times a week  Diet to Follow: 15-20 g carbohydrates lean protein Low-carb vegetables              Nutritional Diagnosis:  [place]-3.3 Overweight/obesity related to history of excess calories and physical inactivity as evidenced by patient current BMI of 60.34, following dietary guidelines for continued weight loss prior to bariatric surgery.              Intervention:   . Nutrition counseling for weight loss prior to upcoming bariatric surgery. . Patient has formulated his own goals for this week, to begin sampling protein drinks, consume some meatless meals as he feels better when avoiding (especially) red meats. Advised patient to include a protein source of high-fiber beans or hummus if he does not include meat with a meal.  . Discussed pre-op diet purpose and guidelines, and encouraged him to limit carb intake with meals to ideally 15-20grams; following a modified form of pre-op diet will likely be best for this patient given his height and large body frame.    Teaching Method Utilized:  Visual Auditory Hands on   Barriers to learning/adherence to lifestyle change: none  Demonstrated degree of understanding via:  Teach Back    Monitoring/Evaluation:  Dietary intake, exercise, and body weight 09/20/19 at 9am for pre-op class.

## 2019-09-16 NOTE — Patient Instructions (Addendum)
   Continue to increase exercise, to goal of walking one mile daily.   Eat some meatless meals to help with calorie control.   Start trying some protein shakes; if you try Ensure, make sure it's the Protein Max version to meet bariatric guidelines.

## 2019-09-20 ENCOUNTER — Encounter: Payer: Managed Care, Other (non HMO) | Attending: General Surgery | Admitting: Dietician

## 2019-09-20 ENCOUNTER — Other Ambulatory Visit: Payer: Self-pay

## 2019-09-20 VITALS — Ht 74.0 in | Wt >= 6400 oz

## 2019-09-20 DIAGNOSIS — Z713 Dietary counseling and surveillance: Secondary | ICD-10-CM | POA: Insufficient documentation

## 2019-09-20 DIAGNOSIS — J45909 Unspecified asthma, uncomplicated: Secondary | ICD-10-CM | POA: Diagnosis not present

## 2019-09-20 DIAGNOSIS — Z6841 Body Mass Index (BMI) 40.0 and over, adult: Secondary | ICD-10-CM | POA: Diagnosis not present

## 2019-09-20 DIAGNOSIS — G4730 Sleep apnea, unspecified: Secondary | ICD-10-CM | POA: Insufficient documentation

## 2019-09-20 NOTE — Progress Notes (Signed)
Pre-Operative Nutrition Class:  Appt start time: 0900   End time:  1015.  Patient was seen on 09/20/19 for Pre-Operative Bariatric Surgery Education at [facility] at [facility].   Surgery date: TBD, target 09/2019 Surgery type: sleeve gastrectomy Start weight at [facility]: 477.2lbs Weight today: 467.9lbs  InBody  BODY COMP RESULTS    BMI (kg/m^2) 60.0  Fat Mass (lbs) 215.3  Dry Lean Mass (lbs) 66.8  Total Body Water (lbs) 185.9   Samples given per MNT protocol. Patient educated on appropriate usage: Celebrate Vitamins Multivitamin  Lot # [id number],  Exp: 12/2020; [phone], Exp: 01/2021; Lot# [id number], Exp: 08/2020; Lot# [id number], Exp: 08/2020  Celebrate Vitamins B12 Quick Melt  Lot# [id number], Exp: 10/2019 Celebrate Vitamins Iron Chews Lot# [id number], Exp: 03/2021; Lot# [id number], Exp: 03/2021  Celebrate Vitamins Calcium Citrate   Lot # [id number], Exp: 06/2020; Lot# [id number], Exp: 03/2020; Lot# [id number], Exp: 04/2020; Lot# [id number], Exp: 05/2020; Lot# [id number], Exp: 09/2019; Lot# [id number], 04/2020; Lot# [id number], Exp: 04/2020; Lot# [id number], Exp: 08/2020  Renee Pain Protein Powder   Lot # [id number], Exp: 03/2020, Lot# [id number],  Exp: 03/2020; Lot# [id number], Exp: 03/2020  Unjury Protein shake  Lot# [id number], Exp: 10/07/2019  Premier Protein Shake   Lot# [id number], Exp: 08/04/2020  The following the learning objectives were met by the patient during this course:  Identify Pre-Op Dietary Goals and will begin 2 weeks pre-operatively  Identify appropriate sources of fluids and proteins   State protein recommendations and appropriate sources pre and post-operatively  Identify Post-Operative Dietary Goals and will follow for 2 weeks post-operatively  Identify appropriate multivitamin and calcium sources  Describe the need for physical activity post-operatively and will follow MD recommendations  State when to call healthcare provider regarding medication questions or post-operative complications  Handouts given during  class include:  Pre-Op Bariatric Surgery Diet Handout  Protein Shake Handout  Post-Op Bariatric Surgery Nutrition Handout  BELT Program Information Flyer  Support Group Information Flyer  [facility] Bariatric Supplements Price List  Follow-Up Plan: Patient will follow-up at [place], at about 2 weeks post operatively for diet advancement per MD.

## 2019-10-01 ENCOUNTER — Ambulatory Visit: Payer: Self-pay | Admitting: General Surgery

## 2019-10-02 NOTE — Progress Notes (Signed)
PCP - No PCP Cardiologist - none  Chest x-ray - none EKG - none Stress Test - none ECHO - none Cardiac Cath - none  Sleep Study - 2013 CPAP - does not use  Fasting Blood Sugar - NA Checks Blood Sugar _____ times a day  Blood Thinner Instructions: none Aspirin Instructions: Last Dose:  Anesthesia review:   Patient denies shortness of breath, fever, cough and chest pain at PAT appointment   Patient verbalized understanding of instructions that were given to them at the PAT appointment. Patient was also instructed that they will need to review over the PAT instructions again at home before surgery.

## 2019-10-02 NOTE — Patient Instructions (Addendum)
DUE TO COVID-19 ONLY ONE VISITOR IS ALLOWED TO COME WITH YOU AND STAY IN THE WAITING ROOM ONLY DURING PRE OP AND PROCEDURE DAY OF SURGERY. THE 1 VISITOR MAY VISIT WITH YOU AFTER SURGERY IN YOUR PRIVATE ROOM DURING VISITING HOURS ONLY!  YOU NEED TO HAVE A COVID 19 TEST ON_Friday 12/18/2020______ @_12 :30 pm______, THIS TEST MUST BE DONE BEFORE SURGERY, COME  Steve Love , [id number].  ([place]) ONCE YOUR COVID TEST IS COMPLETED, PLEASE BEGIN THE QUARANTINE INSTRUCTIONS AS OUTLINED IN YOUR HANDOUT.                Steve Love    Your procedure is scheduled on: Monday 10/07/2019   Report to [facility] Main  Entrance    Report to Short Stay at 5:30 am.     Call this number if you have problems the morning of surgery [phone]    Remember: Do not eat food after Midnight.    BRUSH YOUR TEETH MORNING OF SURGERY AND RINSE YOUR MOUTH OUT, NO CHEWING GUM CANDY OR MINTS.     CLEAR LIQUID DIET   Foods Allowed                                                                     Foods Excluded  Coffee and tea, regular and decaf                             liquids that you cannot  Plain Jell-O any favor except red or purple             see through such as: Fruit ices (not with fruit pulp)                                     milk, soups, orange juice  Iced Popsicles                                    All solid food Carbonated beverages, regular and diet                                    Cranberry, grape and apple juices Sports drinks like Gatorade Lightly seasoned clear broth or consume(fat free) Sugar, honey syrup  Sample Menu Breakfast                                Lunch                                     Supper Cranberry juice                    Beef broth  Chicken broth Jell-O                                     Grape juice                           Apple juice Coffee or tea                        Jell-O                                       Popsicle                                                Coffee or tea                        Coffee or tea  _____________________________________________________________________  NO SOLID FOOD AFTER MIDNIGHT THE NIGHT PRIOR TO SURGERY.   NOTHING BY MOUTH EXCEPT CLEAR LIQUIDS UNTIL 4:15 am.    PLEASE FINISH ENSURE DRINK PER SURGEON ORDER  WHICH NEEDS TO BE COMPLETED AT 4:15 am.     Take these medicines the morning of surgery with A SIP OF WATER: None                                   You may not have any metal on your body including hair pins and              piercings  Do not wear jewelry, make-up, lotions, powders or perfumes, deodorant             Do not wear nail polish on your fingernails.  Do not shave  48 hours prior to surgery.                 Do not bring valuables to the hospital. [facility] IS NOT             RESPONSIBLE  FOR VALUABLES.  Contacts, dentures or bridgework may not be worn into surgery.  Leave suitcase in the car. After surgery it may be brought to your room.       Special Instructions: N/A              Please read over the following fact sheets you were given: _____________________________________________________________________             [facility] - Preparing for Surgery Before surgery, you can play an important role.  Because skin is not sterile, your skin needs to be as free of germs as possible.  You can reduce the number of germs on your skin by washing with CHG (chlorahexidine gluconate) soap before surgery.  CHG is an antiseptic cleaner which kills germs and bonds with the skin to continue killing germs even after washing. Please DO NOT use if you have an allergy to CHG or antibacterial soaps.  If your skin becomes reddened/irritated stop using the CHG and inform your nurse when you arrive at Short Stay. Do not shave (including legs and underarms)  for at least 48 hours prior to the first CHG shower.  You  may shave your face/neck. Please follow these instructions carefully:  1.  Shower with CHG Soap the night before surgery and the  morning of Surgery.  2.  If you choose to wash your hair, wash your hair first as usual with your  normal  shampoo.  3.  After you shampoo, rinse your hair and body thoroughly to remove the  shampoo.                             4.  Use CHG as you would any other liquid soap.  You can apply chg directly  to the skin and wash                       Gently with a scrungie or clean washcloth.  5.  Apply the CHG Soap to your body ONLY FROM THE NECK DOWN.   Do not use on face/ open                           Wound or open sores. Avoid contact with eyes, ears mouth and genitals (private parts).                       Wash face,  Genitals (private parts) with your normal soap.             6.  Wash thoroughly, paying special attention to the area where your surgery  will be performed.  7.  Thoroughly rinse your body with warm water from the neck down.  8.  DO NOT shower/wash with your normal soap after using and rinsing off  the CHG Soap.                9.  Pat yourself dry with a clean towel.            10.  Wear clean pajamas.            11.  Place clean sheets on your bed the night of your first shower and do not  sleep with pets. Day of Surgery : Do not apply any lotions/deodorants the morning of surgery.  Please wear clean clothes to the hospital/surgery center.  FAILURE TO FOLLOW THESE INSTRUCTIONS MAY RESULT IN THE CANCELLATION OF YOUR SURGERY PATIENT SIGNATURE_________________________________  NURSE SIGNATURE__________________________________  ________________________________________________________________________   Steve MireIncentive Spirometer  An incentive spirometer is a tool that can help keep your lungs clear and active. This tool measures how well you are filling your lungs with each breath. Taking long deep breaths may help reverse or decrease the chance of  developing breathing (pulmonary) problems (especially infection) following:  A long period of time when you are unable to move or be active. BEFORE THE PROCEDURE   If the spirometer includes an indicator to show your best effort, your nurse or respiratory therapist will set it to a desired goal.  If possible, sit up straight or lean slightly forward. Try not to slouch.  Hold the incentive spirometer in an upright position. INSTRUCTIONS FOR USE  1. Sit on the edge of your bed if possible, or sit up as far as you can in bed or on a chair. 2. Hold the incentive spirometer in an upright position. 3. Breathe out normally. 4. Place the mouthpiece in your mouth and seal  your lips tightly around it. 5. Breathe in slowly and as deeply as possible, raising the piston or the ball toward the top of the column. 6. Hold your breath for 3-5 seconds or for as long as possible. Allow the piston or ball to fall to the bottom of the column. 7. Remove the mouthpiece from your mouth and breathe out normally. 8. Rest for a few seconds and repeat Steps 1 through 7 at least 10 times every 1-2 hours when you are awake. Take your time and take a few normal breaths between deep breaths. 9. The spirometer may include an indicator to show your best effort. Use the indicator as a goal to work toward during each repetition. 10. After each set of 10 deep breaths, practice coughing to be sure your lungs are clear. If you have an incision (the cut made at the time of surgery), support your incision when coughing by placing a pillow or rolled up towels firmly against it. Once you are able to get out of bed, walk around indoors and cough well. You may stop using the incentive spirometer when instructed by your caregiver.  RISKS AND COMPLICATIONS  Take your time so you do not get dizzy or light-headed.  If you are in pain, you may need to take or ask for pain medication before doing incentive spirometry. It is harder to take a  deep breath if you are having pain. AFTER USE  Rest and breathe slowly and easily.  It can be helpful to keep track of a log of your progress. Your caregiver can provide you with a simple table to help with this. If you are using the spirometer at home, follow these instructions: SEEK MEDICAL CARE IF:   You are having difficultly using the spirometer.  You have trouble using the spirometer as often as instructed.  Your pain medication is not giving enough relief while using the spirometer.  You develop fever of 100.5 F (38.1 C) or higher. SEEK IMMEDIATE MEDICAL CARE IF:   You cough up bloody sputum that had not been present before.  You develop fever of 102 F (38.9 C) or greater.  You develop worsening pain at or near the incision site. MAKE SURE YOU:   Understand these instructions.  Will watch your condition.  Will get help right away if you are not doing well or get worse. Document Released: 02/13/2007 Document Revised: 12/26/2011 Document Reviewed: 04/16/2007 [facility] Patient Information 2014 [place], [place].   ________________________________________________________________________

## 2019-10-03 ENCOUNTER — Other Ambulatory Visit: Payer: Self-pay

## 2019-10-03 ENCOUNTER — Encounter (HOSPITAL_COMMUNITY)
Admission: RE | Admit: 2019-10-03 | Discharge: 2019-10-03 | Disposition: A | Discharge status: Home / Self Care | Payer: Managed Care, Other (non HMO) | Source: Ambulatory Visit | Attending: General Surgery | Admitting: General Surgery

## 2019-10-03 ENCOUNTER — Encounter (HOSPITAL_COMMUNITY): Payer: Self-pay

## 2019-10-03 DIAGNOSIS — Z20828 Contact with and (suspected) exposure to other viral communicable diseases: Secondary | ICD-10-CM | POA: Diagnosis not present

## 2019-10-03 DIAGNOSIS — Z01812 Encounter for preprocedural laboratory examination: Secondary | ICD-10-CM | POA: Insufficient documentation

## 2019-10-04 ENCOUNTER — Other Ambulatory Visit (HOSPITAL_COMMUNITY)
Admission: RE | Admit: 2019-10-04 | Discharge: 2019-10-04 | Disposition: A | Discharge status: Home / Self Care | Payer: Managed Care, Other (non HMO) | Source: Ambulatory Visit | Attending: General Surgery | Admitting: General Surgery

## 2019-10-04 ENCOUNTER — Encounter (HOSPITAL_COMMUNITY)
Admission: RE | Admit: 2019-10-04 | Discharge: 2019-10-04 | Disposition: A | Discharge status: Home / Self Care | Payer: Managed Care, Other (non HMO) | Source: Ambulatory Visit | Attending: General Surgery | Admitting: General Surgery

## 2019-10-04 DIAGNOSIS — Z01812 Encounter for preprocedural laboratory examination: Secondary | ICD-10-CM | POA: Diagnosis not present

## 2019-10-04 LAB — COMPREHENSIVE METABOLIC PANEL
ALT: 35 U/L (ref 0–44)
AST: 21 U/L (ref 15–41)
Albumin: 3.7 g/dL (ref 3.5–5.0)
Alkaline Phosphatase: 38 U/L (ref 38–126)
Anion gap: 9 (ref 5–15)
BUN: 10 mg/dL (ref 6–20)
CO2: 26 mmol/L (ref 22–32)
Calcium: 9.2 mg/dL (ref 8.9–10.3)
Chloride: 105 mmol/L (ref 98–111)
Creatinine, Ser: 0.99 mg/dL (ref 0.61–1.24)
GFR calc Af Amer: >60 mL/min (ref >60)
GFR calc non Af Amer: >60 mL/min (ref >60)
Glucose, Bld: 105 mg/dL — ABNORMAL HIGH (ref 70–99)
Potassium: 3.7 mmol/L (ref 3.5–5.1)
Sodium: 140 mmol/L (ref 135–145)
Total Bilirubin: 0.6 mg/dL (ref 0.3–1.2)
Total Protein: 7.4 g/dL (ref 6.5–8.1)

## 2019-10-04 LAB — CBC WITH DIFFERENTIAL/PLATELET
Abs Immature Granulocytes: 0.03 10*3/uL (ref 0.00–0.07)
Basophils Absolute: 0 10*3/uL (ref 0.0–0.1)
Basophils Relative: 1 %
Eosinophils Absolute: 0.1 10*3/uL (ref 0.0–0.5)
Eosinophils Relative: 2 %
HCT: 46.2 % (ref 39.0–52.0)
Hemoglobin: 14.2 g/dL (ref 13.0–17.0)
Immature Granulocytes: 0 %
Lymphocytes Relative: 28 %
Lymphs Abs: 2 10*3/uL (ref 0.7–4.0)
MCH: 27 pg (ref 26.0–34.0)
MCHC: 30.7 g/dL (ref 30.0–36.0)
MCV: 87.8 fL (ref 80.0–100.0)
Monocytes Absolute: 0.6 10*3/uL (ref 0.1–1.0)
Monocytes Relative: 8 %
Neutro Abs: 4.4 10*3/uL (ref 1.7–7.7)
Neutrophils Relative %: 61 %
Platelets: 256 10*3/uL (ref 150–400)
RBC: 5.26 MIL/uL (ref 4.22–5.81)
RDW: 12.9 % (ref 11.5–15.5)
WBC: 7.2 10*3/uL (ref 4.0–10.5)
nRBC: 0 % (ref 0.0–0.2)

## 2019-10-04 LAB — SARS CORONAVIRUS 2 (TAT 6-24 HRS): SARS Coronavirus 2: NEGATIVE

## 2019-10-04 LAB — ABO/RH: ABO/RH(D): B NEG

## 2019-10-06 IMAGING — RF DG UGI W/ KUB
11 series · 11 of 11 positions shown · non-contrast
Comparison: None.

CLINICAL DATA: Pre bariatric screening, morbid obesity. No
abdominal complaints.

EXAM:
UPPER GI SERIES WITH KUB
TECHNIQUE: After obtaining a scout radiograph a routine upper GI series was
performed using thin and high density barium. Effervescent crystals
and a barium tablet were administered.
FLUOROSCOPY TIME:  Fluoroscopy Time:  1 minutes, 24 seconds.
Radiation Exposure Index (if provided by the fluoroscopic device):
87.5 mGy
Number of Acquired Spot Images: 9

[Series 1: fluoro_barium 2fps_bw · 0.17mm/px · 1 of 1 slices shown (1 of 9)]
[im 1/1]
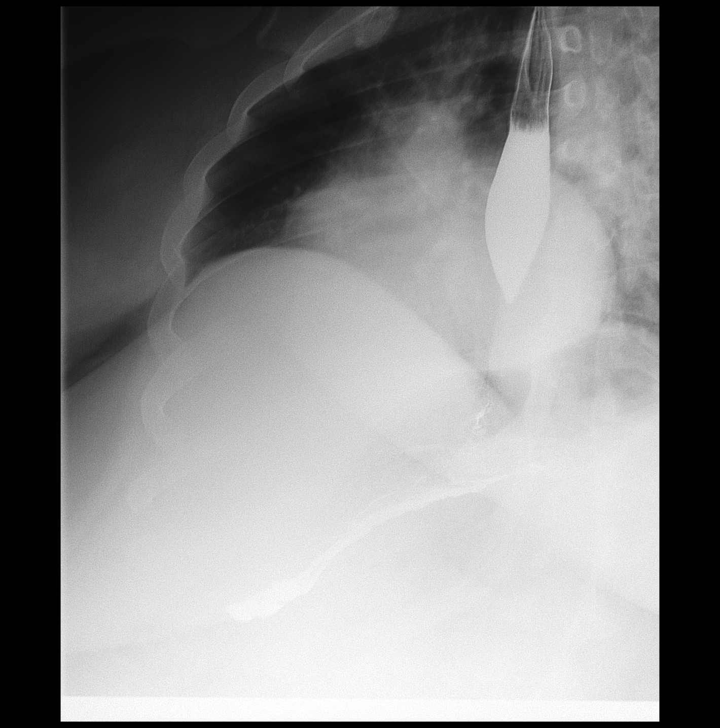

[Series 2: abdomen kub · 0.14mm/px · 1 of 1 slices shown (1 of 2)]
[im 1/1]
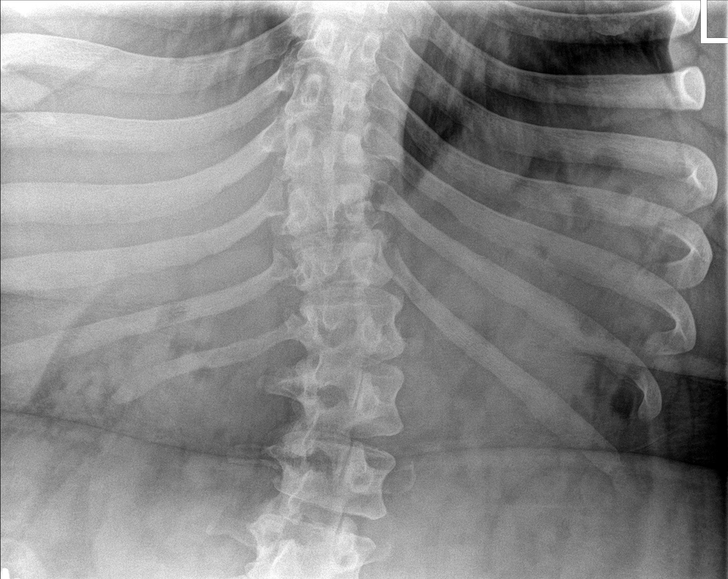

[Series 2: fluoro_barium 2fps_bw · 0.17mm/px · 1 of 1 slices shown (2 of 9)]
[im 1/1]
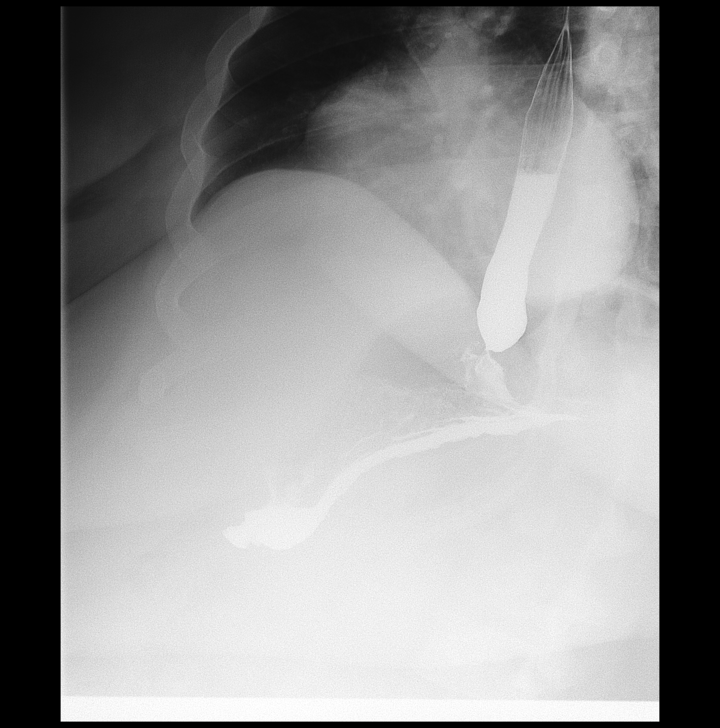

[Series 3: fluoro_barium 2fps_bw · 0.18mm/px · 1 of 1 slices shown (3 of 9)]
[im 1/1]
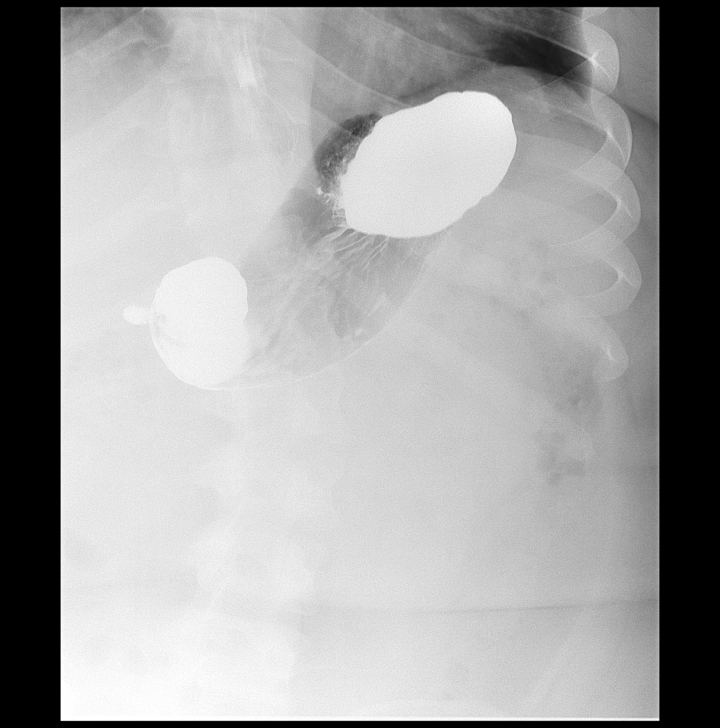

[Series 3: abdomen kub · 0.14mm/px · 1 of 1 slices shown (2 of 2)]
[im 1/1]
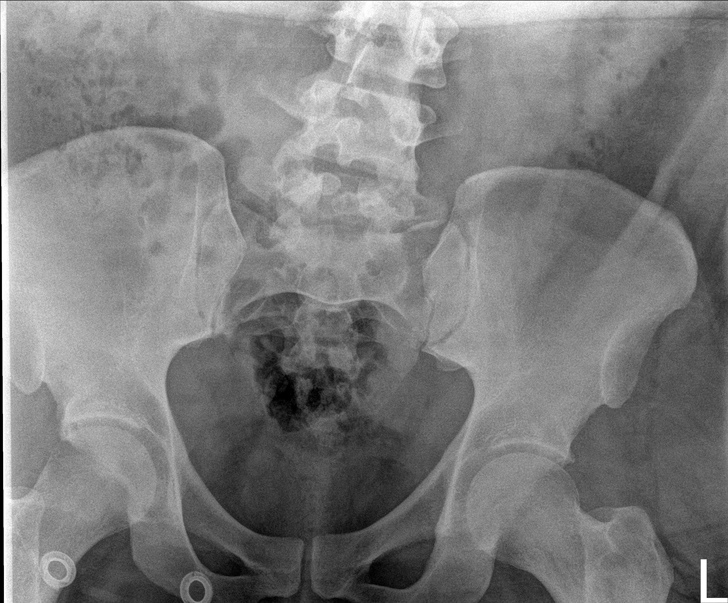

[Series 4: fluoro_barium 2fps_bw · 0.17mm/px · 1 of 1 slices shown (4 of 9)]
[im 1/1]
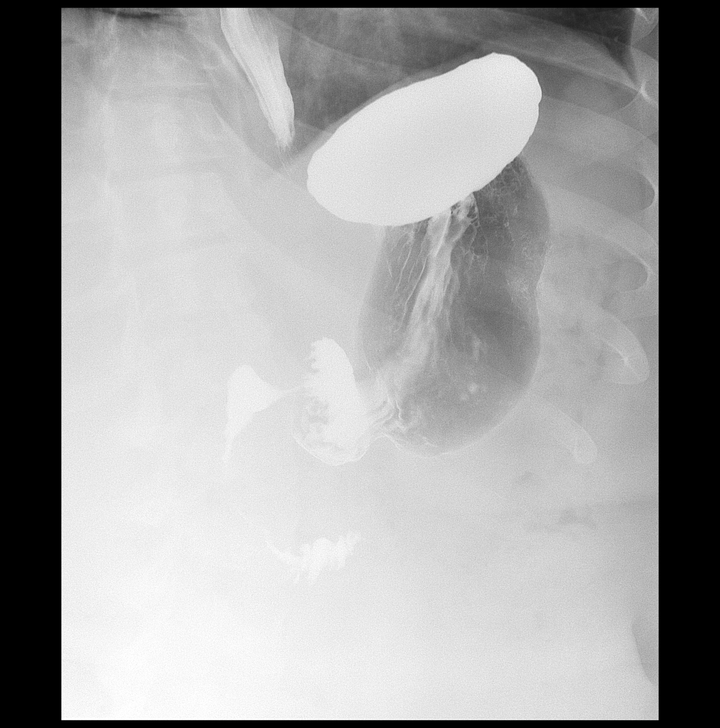

[Series 5: fluoro_barium 2fps_bw · 0.17mm/px · 1 of 1 slices shown (5 of 9)]
[im 1/1]
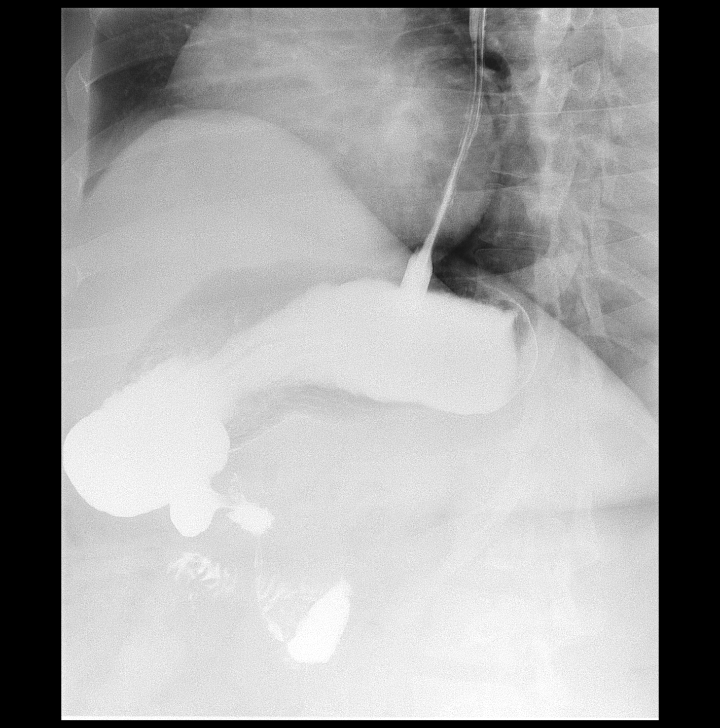

[Series 6: fluoro_barium 2fps_bw · 0.17mm/px · 1 of 1 slices shown (6 of 9)]
[im 1/1]
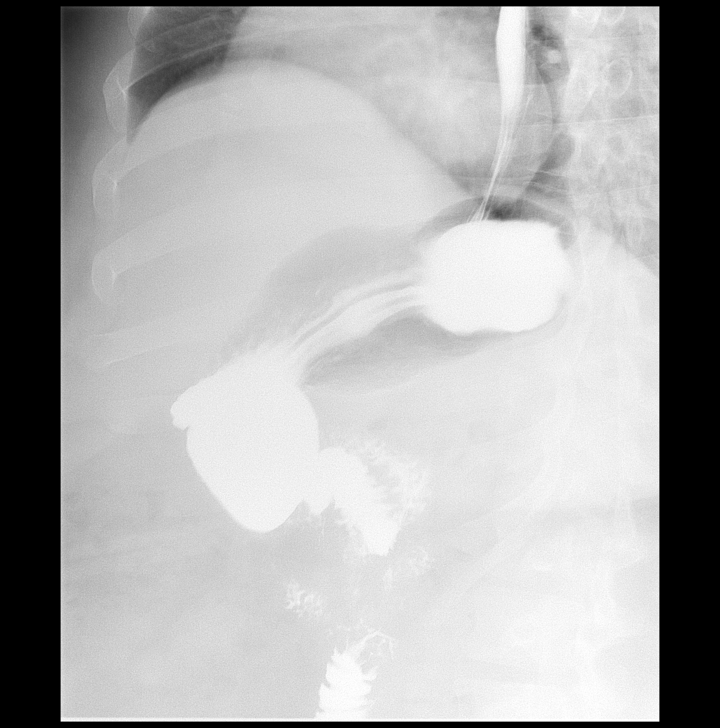

[Series 7: fluoro_barium 2fps_bw · 0.17mm/px · 1 of 1 slices shown (7 of 9)]
[im 1/1]
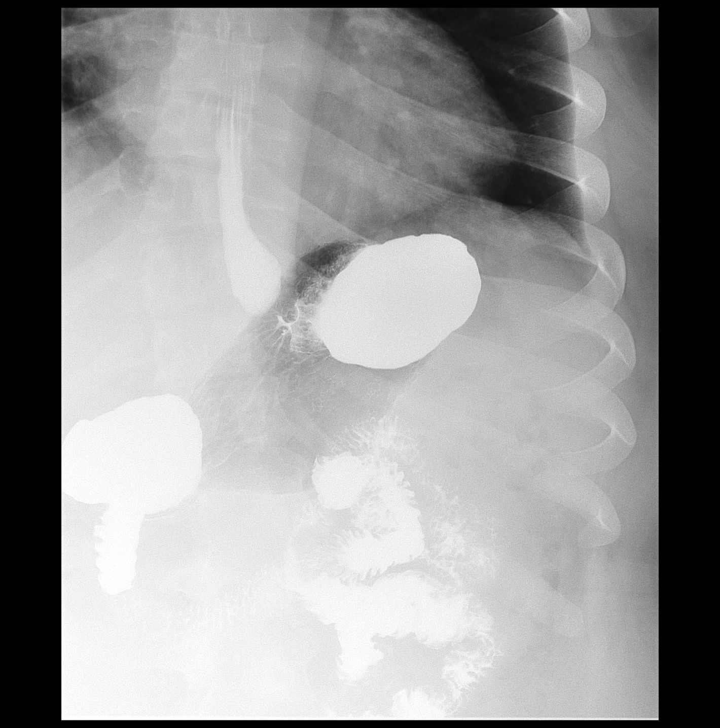

[Series 8: fluoro_barium 2fps_bw · 0.17mm/px · 1 of 1 slices shown (8 of 9)]
[im 1/1]
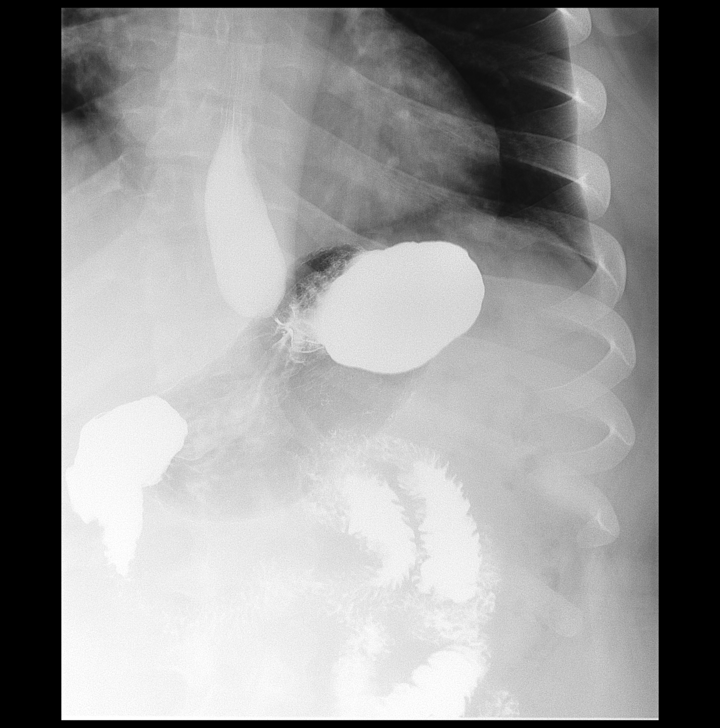

[Series 9: fluoro_barium 2fps_bw · 0.17mm/px · 1 of 1 slices shown (9 of 9)]
[im 1/1]
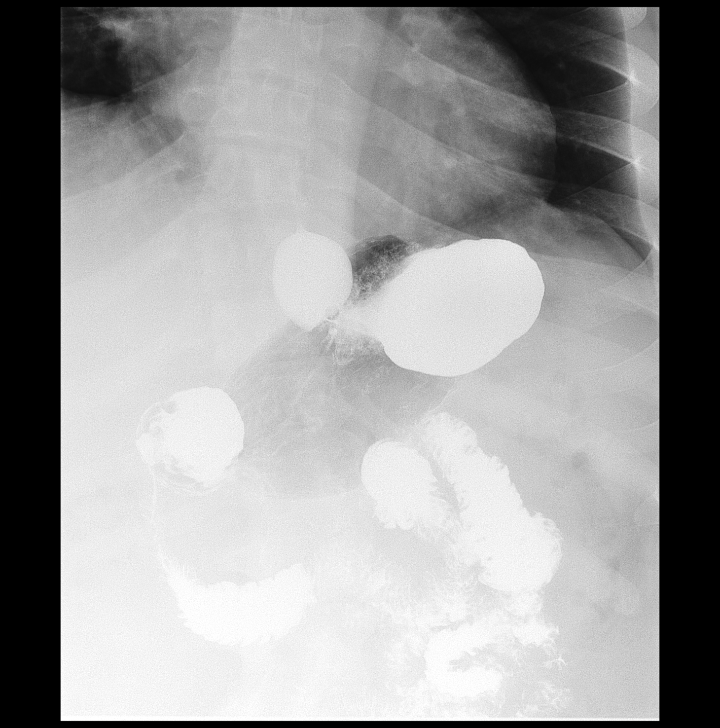

[11 of 11 positions shown; findings below may reference images not displayed]

FINDINGS: The scout film was unremarkable. The patient ingested thick and thin
barium and the gas-forming crystals without difficulty. The thoracic
esophagus was normal in a survey fashion. There was a tiny reducible
hiatal hernia. A small amount of gastroesophageal reflux was
observed. The stomach distended well. The mucosal fold pattern was
normal. Gastric emptying was prompt. The duodenal bulb and C sweep
were normal. The barium tablet passed promptly from the mouth to the
stomach.
IMPRESSION: Small reducible hiatal hernia. Small amount of gastroesophageal
reflux. Normal appearance of the stomach and duodenum.

## 2019-10-07 ENCOUNTER — Encounter (HOSPITAL_COMMUNITY): Payer: Self-pay | Admitting: General Surgery

## 2019-10-07 ENCOUNTER — Inpatient Hospital Stay (HOSPITAL_COMMUNITY): Payer: Managed Care, Other (non HMO) | Admitting: Physician Assistant

## 2019-10-07 ENCOUNTER — Other Ambulatory Visit: Payer: Self-pay

## 2019-10-07 ENCOUNTER — Encounter (HOSPITAL_COMMUNITY)
Admission: RE | Disposition: A | Discharge status: Home / Self Care | Payer: Self-pay | Source: Home / Self Care | Attending: General Surgery

## 2019-10-07 ENCOUNTER — Inpatient Hospital Stay (HOSPITAL_COMMUNITY): Payer: Managed Care, Other (non HMO) | Admitting: Registered Nurse

## 2019-10-07 ENCOUNTER — Inpatient Hospital Stay (HOSPITAL_COMMUNITY)
Admission: RE | Admit: 2019-10-07 | Discharge: 2019-10-08 | DRG: 621 | Disposition: A | Discharge status: Home / Self Care | Payer: Managed Care, Other (non HMO) | Attending: General Surgery | Admitting: General Surgery

## 2019-10-07 DIAGNOSIS — I10 Essential (primary) hypertension: Secondary | ICD-10-CM | POA: Diagnosis present

## 2019-10-07 DIAGNOSIS — G4730 Sleep apnea, unspecified: Secondary | ICD-10-CM | POA: Diagnosis present

## 2019-10-07 DIAGNOSIS — Z6841 Body Mass Index (BMI) 40.0 and over, adult: Secondary | ICD-10-CM

## 2019-10-07 DIAGNOSIS — Z8249 Family history of ischemic heart disease and other diseases of the circulatory system: Secondary | ICD-10-CM

## 2019-10-07 DIAGNOSIS — Z20828 Contact with and (suspected) exposure to other viral communicable diseases: Secondary | ICD-10-CM | POA: Diagnosis present

## 2019-10-07 DIAGNOSIS — J45909 Unspecified asthma, uncomplicated: Secondary | ICD-10-CM | POA: Diagnosis present

## 2019-10-07 DIAGNOSIS — Z8042 Family history of malignant neoplasm of prostate: Secondary | ICD-10-CM | POA: Diagnosis not present

## 2019-10-07 HISTORY — PX: LAPAROSCOPIC GASTRIC SLEEVE RESECTION: SHX5895

## 2019-10-07 LAB — HEMOGLOBIN AND HEMATOCRIT, BLOOD
HCT: 46.9 % (ref 39.0–52.0)
Hemoglobin: 14.2 g/dL (ref 13.0–17.0)

## 2019-10-07 LAB — TYPE AND SCREEN
ABO/RH(D): B NEG
Antibody Screen: NEGATIVE

## 2019-10-07 SURGERY — GASTRECTOMY, SLEEVE, LAPAROSCOPIC
Anesthesia: General | Site: Abdomen

## 2019-10-07 MED ORDER — KETAMINE HCL 10 MG/ML IJ SOLN
INTRAMUSCULAR | Status: DC | PRN
  Administered 2019-10-07: 40 mg via INTRAVENOUS

## 2019-10-07 MED ORDER — LIDOCAINE 2% (20 MG/ML) 5 ML SYRINGE
INTRAMUSCULAR | Status: DC | PRN
  Administered 2019-10-07: 1.5 mg/kg/h via INTRAVENOUS
  Administered 2019-10-07: 80 mg via INTRAVENOUS

## 2019-10-07 MED ORDER — ACETAMINOPHEN 500 MG PO TABS: 1000.0000 mg | ORAL_TABLET | Freq: Once | ORAL | Status: DC | PRN

## 2019-10-07 MED ORDER — BUPIVACAINE HCL 0.25 % IJ SOLN
INTRAMUSCULAR | Status: DC | PRN
  Administered 2019-10-07: 30 mL

## 2019-10-07 MED ORDER — LIDOCAINE HCL 2 % IJ SOLN
INTRAMUSCULAR | Status: AC
  Filled 2019-10-07: qty 20

## 2019-10-07 MED ORDER — MORPHINE SULFATE (PF) 2 MG/ML IV SOLN
1.0000 mg | INTRAVENOUS | Status: DC | PRN
  Administered 2019-10-07: 3 mg via INTRAVENOUS
  Filled 2019-10-07: qty 2

## 2019-10-07 MED ORDER — BUPIVACAINE HCL 0.25 % IJ SOLN
INTRAMUSCULAR | Status: AC
  Filled 2019-10-07: qty 1

## 2019-10-07 MED ORDER — EPHEDRINE SULFATE-NACL 50-0.9 MG/10ML-% IV SOSY
PREFILLED_SYRINGE | INTRAVENOUS | Status: DC | PRN
  Administered 2019-10-07 (×2): 10 mg via INTRAVENOUS

## 2019-10-07 MED ORDER — METOPROLOL TARTRATE 5 MG/5ML IV SOLN
5.0000 mg | Freq: Four times a day (QID) | INTRAVENOUS | Status: DC | PRN
  Administered 2019-10-07 (×2): 5 mg via INTRAVENOUS
  Filled 2019-10-07 (×2): qty 5

## 2019-10-07 MED ORDER — ACETAMINOPHEN 160 MG/5ML PO SOLN: 1000.0000 mg | Freq: Once | ORAL | Status: DC | PRN

## 2019-10-07 MED ORDER — SUGAMMADEX SODIUM 500 MG/5ML IV SOLN
INTRAVENOUS | Status: DC | PRN
  Administered 2019-10-07: 420 mg via INTRAVENOUS

## 2019-10-07 MED ORDER — DEXTROSE-NACL 5-0.45 % IV SOLN
INTRAVENOUS | Status: DC
  Administered 2019-10-07: 11:00:00 1000 mL via INTRAVENOUS

## 2019-10-07 MED ORDER — PROPOFOL 10 MG/ML IV BOLUS
INTRAVENOUS | Status: AC
  Filled 2019-10-07: qty 40

## 2019-10-07 MED ORDER — FENTANYL CITRATE (PF) 100 MCG/2ML IJ SOLN
INTRAMUSCULAR | Status: AC
  Filled 2019-10-07: qty 2

## 2019-10-07 MED ORDER — FENTANYL CITRATE (PF) 100 MCG/2ML IJ SOLN
INTRAMUSCULAR | Status: DC | PRN
  Administered 2019-10-07: 50 ug via INTRAVENOUS
  Administered 2019-10-07: 25 ug via INTRAVENOUS

## 2019-10-07 MED ORDER — SUCCINYLCHOLINE CHLORIDE 200 MG/10ML IV SOSY
PREFILLED_SYRINGE | INTRAVENOUS | Status: AC
  Filled 2019-10-07: qty 10

## 2019-10-07 MED ORDER — LIDOCAINE 2% (20 MG/ML) 5 ML SYRINGE
INTRAMUSCULAR | Status: AC
  Filled 2019-10-07: qty 5

## 2019-10-07 MED ORDER — BUPIVACAINE LIPOSOME 1.3 % IJ SUSP
INTRAMUSCULAR | Status: DC | PRN
  Administered 2019-10-07: 20 mL

## 2019-10-07 MED ORDER — FENTANYL CITRATE (PF) 100 MCG/2ML IJ SOLN
25.0000 ug | INTRAMUSCULAR | Status: DC | PRN
  Administered 2019-10-07 (×3): 50 ug via INTRAVENOUS

## 2019-10-07 MED ORDER — APREPITANT 40 MG PO CAPS
40.0000 mg | ORAL_CAPSULE | ORAL | Status: AC
  Administered 2019-10-07: 40 mg via ORAL
  Filled 2019-10-07: qty 1

## 2019-10-07 MED ORDER — SUGAMMADEX SODIUM 500 MG/5ML IV SOLN
INTRAVENOUS | Status: AC
  Filled 2019-10-07: qty 5

## 2019-10-07 MED ORDER — SCOPOLAMINE 1 MG/3DAYS TD PT72
1.0000 | MEDICATED_PATCH | TRANSDERMAL | Status: DC
  Administered 2019-10-07: 06:00:00 1.5 mg via TRANSDERMAL
  Filled 2019-10-07: qty 1

## 2019-10-07 MED ORDER — CHLORHEXIDINE GLUCONATE 4 % EX LIQD: 60.0000 mL | Freq: Once | CUTANEOUS | Status: DC

## 2019-10-07 MED ORDER — FAMOTIDINE IN NACL 20-0.9 MG/50ML-% IV SOLN
20.0000 mg | Freq: Two times a day (BID) | INTRAVENOUS | Status: DC
  Administered 2019-10-07 – 2019-10-08 (×3): 20 mg via INTRAVENOUS
  Filled 2019-10-07 (×3): qty 50

## 2019-10-07 MED ORDER — OXYCODONE HCL 5 MG/5ML PO SOLN
5.0000 mg | Freq: Four times a day (QID) | ORAL | Status: DC | PRN
  Administered 2019-10-07: 5 mg via ORAL
  Filled 2019-10-07: qty 5

## 2019-10-07 MED ORDER — ACETAMINOPHEN 500 MG PO TABS
1000.0000 mg | ORAL_TABLET | ORAL | Status: AC
  Administered 2019-10-07: 1000 mg via ORAL
  Filled 2019-10-07: qty 2

## 2019-10-07 MED ORDER — ROCURONIUM BROMIDE 10 MG/ML (PF) SYRINGE
PREFILLED_SYRINGE | INTRAVENOUS | Status: AC
  Filled 2019-10-07: qty 10

## 2019-10-07 MED ORDER — MIDAZOLAM HCL 2 MG/2ML IJ SOLN
INTRAMUSCULAR | Status: AC
  Filled 2019-10-07: qty 2

## 2019-10-07 MED ORDER — BUPIVACAINE LIPOSOME 1.3 % IJ SUSP
20.0000 mL | Freq: Once | INTRAMUSCULAR | Status: DC
  Filled 2019-10-07: qty 20

## 2019-10-07 MED ORDER — SODIUM CHLORIDE 0.9 % IV SOLN
2.0000 g | INTRAVENOUS | Status: AC
  Administered 2019-10-07: 2 g via INTRAVENOUS
  Filled 2019-10-07: qty 2

## 2019-10-07 MED ORDER — ENOXAPARIN (LOVENOX) PATIENT EDUCATION KIT
PACK | Freq: Once | Status: AC
  Filled 2019-10-07: qty 1

## 2019-10-07 MED ORDER — MIDAZOLAM HCL 5 MG/5ML IJ SOLN
INTRAMUSCULAR | Status: DC | PRN
  Administered 2019-10-07: 2 mg via INTRAVENOUS

## 2019-10-07 MED ORDER — DEXAMETHASONE SODIUM PHOSPHATE 4 MG/ML IJ SOLN
4.0000 mg | INTRAMUSCULAR | Status: AC
  Administered 2019-10-07: 6 mg via INTRAVENOUS

## 2019-10-07 MED ORDER — LACTATED RINGERS IR SOLN
Status: DC | PRN
  Administered 2019-10-07: 1000 mL

## 2019-10-07 MED ORDER — ONDANSETRON HCL 4 MG/2ML IJ SOLN
INTRAMUSCULAR | Status: DC | PRN
  Administered 2019-10-07: 4 mg via INTRAVENOUS

## 2019-10-07 MED ORDER — SIMETHICONE 80 MG PO CHEW
80.0000 mg | CHEWABLE_TABLET | Freq: Four times a day (QID) | ORAL | Status: DC | PRN
  Administered 2019-10-07: 80 mg via ORAL
  Filled 2019-10-07: qty 1

## 2019-10-07 MED ORDER — ENOXAPARIN SODIUM 30 MG/0.3ML ~~LOC~~ SOLN
30.0000 mg | Freq: Two times a day (BID) | SUBCUTANEOUS | Status: DC
  Administered 2019-10-07 – 2019-10-08 (×2): 30 mg via SUBCUTANEOUS
  Filled 2019-10-07 (×2): qty 0.3

## 2019-10-07 MED ORDER — DEXAMETHASONE SODIUM PHOSPHATE 10 MG/ML IJ SOLN
INTRAMUSCULAR | Status: AC
  Filled 2019-10-07: qty 1

## 2019-10-07 MED ORDER — AMLODIPINE BESYLATE 5 MG PO TABS
5.0000 mg | ORAL_TABLET | Freq: Once | ORAL | Status: AC
  Administered 2019-10-07: 17:00:00 5 mg via ORAL
  Filled 2019-10-07: qty 1

## 2019-10-07 MED ORDER — FENTANYL CITRATE (PF) 250 MCG/5ML IJ SOLN
INTRAMUSCULAR | Status: AC
  Filled 2019-10-07: qty 5

## 2019-10-07 MED ORDER — SUCCINYLCHOLINE CHLORIDE 200 MG/10ML IV SOSY
PREFILLED_SYRINGE | INTRAVENOUS | Status: DC | PRN
  Administered 2019-10-07: 60 mg via INTRAVENOUS
  Administered 2019-10-07: 140 mg via INTRAVENOUS

## 2019-10-07 MED ORDER — KETAMINE HCL 10 MG/ML IJ SOLN
INTRAMUSCULAR | Status: AC
  Filled 2019-10-07: qty 1

## 2019-10-07 MED ORDER — EPHEDRINE 5 MG/ML INJ
INTRAVENOUS | Status: AC
  Filled 2019-10-07: qty 10

## 2019-10-07 MED ORDER — ACETAMINOPHEN 10 MG/ML IV SOLN: 1000.0000 mg | Freq: Once | INTRAVENOUS | Status: DC | PRN

## 2019-10-07 MED ORDER — ACETAMINOPHEN 500 MG PO TABS
1000.0000 mg | ORAL_TABLET | Freq: Three times a day (TID) | ORAL | Status: DC
  Filled 2019-10-07: qty 2

## 2019-10-07 MED ORDER — HEPARIN SODIUM (PORCINE) 5000 UNIT/ML IJ SOLN
5000.0000 [IU] | INTRAMUSCULAR | Status: AC
  Administered 2019-10-07: 06:00:00 5000 [IU] via SUBCUTANEOUS
  Filled 2019-10-07: qty 1

## 2019-10-07 MED ORDER — GABAPENTIN 300 MG PO CAPS
300.0000 mg | ORAL_CAPSULE | ORAL | Status: AC
  Administered 2019-10-07: 300 mg via ORAL
  Filled 2019-10-07: qty 1

## 2019-10-07 MED ORDER — ENSURE MAX PROTEIN PO LIQD
2.0000 [oz_av] | ORAL | Status: DC
  Administered 2019-10-08 (×3): 2 [oz_av] via ORAL

## 2019-10-07 MED ORDER — ONDANSETRON HCL 4 MG/2ML IJ SOLN
INTRAMUSCULAR | Status: AC
  Filled 2019-10-07: qty 2

## 2019-10-07 MED ORDER — OXYCODONE HCL 5 MG PO TABS: 5.0000 mg | ORAL_TABLET | Freq: Once | ORAL | Status: DC | PRN

## 2019-10-07 MED ORDER — OXYCODONE HCL 5 MG/5ML PO SOLN: 5.0000 mg | Freq: Once | ORAL | Status: DC | PRN

## 2019-10-07 MED ORDER — ONDANSETRON HCL 4 MG/2ML IJ SOLN: 4.0000 mg | INTRAMUSCULAR | Status: DC | PRN

## 2019-10-07 MED ORDER — SUGAMMADEX SODIUM 500 MG/5ML IV SOLN: INTRAVENOUS | Status: DC | PRN

## 2019-10-07 MED ORDER — GABAPENTIN 100 MG PO CAPS
200.0000 mg | ORAL_CAPSULE | Freq: Two times a day (BID) | ORAL | Status: DC
  Administered 2019-10-07 – 2019-10-08 (×2): 200 mg via ORAL
  Filled 2019-10-07 (×2): qty 2

## 2019-10-07 MED ORDER — ROCURONIUM BROMIDE 10 MG/ML (PF) SYRINGE
PREFILLED_SYRINGE | INTRAVENOUS | Status: DC | PRN
  Administered 2019-10-07: 10 mg via INTRAVENOUS
  Administered 2019-10-07: 60 mg via INTRAVENOUS

## 2019-10-07 MED ORDER — PROPOFOL 10 MG/ML IV BOLUS
INTRAVENOUS | Status: DC | PRN
  Administered 2019-10-07: 250 mg via INTRAVENOUS

## 2019-10-07 MED ORDER — LACTATED RINGERS IV SOLN: INTRAVENOUS | Status: DC

## 2019-10-07 MED ORDER — ACETAMINOPHEN 160 MG/5ML PO SOLN
1000.0000 mg | Freq: Three times a day (TID) | ORAL | Status: DC
  Administered 2019-10-07 – 2019-10-08 (×3): 1000 mg via ORAL
  Filled 2019-10-07 (×3): qty 40.6

## 2019-10-07 SURGICAL SUPPLY — 56 items
APPLIER CLIP ROT 13.4 12 LRG (CLIP)
BAG LAPAROSCOPIC 12 15 PORT 16 (BASKET) ×1 IMPLANT
BAG RETRIEVAL 12/15 (BASKET) ×2
BAG RETRIEVAL 12/15MM (BASKET) ×1
BENZOIN TINCTURE PRP APPL 2/3 (GAUZE/BANDAGES/DRESSINGS) ×3 IMPLANT
BLADE SURG SZ11 CARB STEEL (BLADE) ×3 IMPLANT
BNDG ADH 1X3 SHEER STRL LF (GAUZE/BANDAGES/DRESSINGS) ×18 IMPLANT
CABLE HIGH FREQUENCY MONO STRZ (ELECTRODE) IMPLANT
CHLORAPREP W/TINT 26 (MISCELLANEOUS) ×3 IMPLANT
CLIP APPLIE ROT 13.4 12 LRG (CLIP) IMPLANT
CLOSURE WOUND 1/2 X4 (GAUZE/BANDAGES/DRESSINGS) ×1
COVER SURGICAL LIGHT HANDLE (MISCELLANEOUS) ×3 IMPLANT
COVER WAND RF STERILE (DRAPES) IMPLANT
DRAPE UTILITY XL STRL (DRAPES) ×6 IMPLANT
ELECT REM PT RETURN 15FT ADLT (MISCELLANEOUS) ×3 IMPLANT
GAUZE 4X4 16PLY RFD (DISPOSABLE) ×3 IMPLANT
GLOVE BIOGEL PI IND STRL 7.0 (GLOVE) ×1 IMPLANT
GLOVE BIOGEL PI INDICATOR 7.0 (GLOVE) ×2
GLOVE SURG SS PI 7.0 STRL IVOR (GLOVE) ×3 IMPLANT
GOWN STRL REUS W/TWL LRG LVL3 (GOWN DISPOSABLE) ×3 IMPLANT
GOWN STRL REUS W/TWL XL LVL3 (GOWN DISPOSABLE) ×9 IMPLANT
GRASPER SUT TROCAR 14GX15 (MISCELLANEOUS) ×3 IMPLANT
HOVERMATT SINGLE USE (MISCELLANEOUS) IMPLANT
KIT BASIN OR (CUSTOM PROCEDURE TRAY) ×3 IMPLANT
KIT TURNOVER KIT A (KITS) IMPLANT
MARKER SKIN DUAL TIP RULER LAB (MISCELLANEOUS) ×3 IMPLANT
NEEDLE SPNL 22GX3.5 QUINCKE BK (NEEDLE) ×3 IMPLANT
PACK UNIVERSAL I (CUSTOM PROCEDURE TRAY) ×3 IMPLANT
PENCIL SMOKE EVACUATOR (MISCELLANEOUS) IMPLANT
RELOAD STAPLER BLUE 60MM (STAPLE) ×4 IMPLANT
RELOAD STAPLER GOLD 60MM (STAPLE) ×1 IMPLANT
RELOAD STAPLER GREEN 60MM (STAPLE) ×1 IMPLANT
SCISSORS LAP 5X45 EPIX DISP (ENDOMECHANICALS) IMPLANT
SET IRRIG TUBING LAPAROSCOPIC (IRRIGATION / IRRIGATOR) ×3 IMPLANT
SET TUBE SMOKE EVAC HIGH FLOW (TUBING) ×3 IMPLANT
SHEARS HARMONIC ACE PLUS 45CM (MISCELLANEOUS) ×3 IMPLANT
SLEEVE GASTRECTOMY 40FR VISIGI (MISCELLANEOUS) ×3 IMPLANT
SLEEVE XCEL OPT CAN 5 100 (ENDOMECHANICALS) ×6 IMPLANT
SOL ANTI FOG 6CC (MISCELLANEOUS) ×1 IMPLANT
SOLUTION ANTI FOG 6CC (MISCELLANEOUS) ×2
STAPLER ECHELON LONG 60 440 (INSTRUMENTS) ×3 IMPLANT
STAPLER RELOAD BLUE 60MM (STAPLE) ×12
STAPLER RELOAD GOLD 60MM (STAPLE) ×3
STAPLER RELOAD GREEN 60MM (STAPLE) ×3
STRIP CLOSURE SKIN 1/2X4 (GAUZE/BANDAGES/DRESSINGS) ×2 IMPLANT
SUT ETHIBOND 0 36 GRN (SUTURE) IMPLANT
SUT MNCRL AB 4-0 PS2 18 (SUTURE) ×3 IMPLANT
SUT VICRYL 0 TIES 12 18 (SUTURE) ×3 IMPLANT
SYR 20ML LL LF (SYRINGE) ×3 IMPLANT
SYR 50ML LL SCALE MARK (SYRINGE) ×3 IMPLANT
TOWEL OR 17X26 10 PK STRL BLUE (TOWEL DISPOSABLE) ×3 IMPLANT
TOWEL OR NON WOVEN STRL DISP B (DISPOSABLE) ×3 IMPLANT
TROCAR BLADELESS 15MM (ENDOMECHANICALS) ×3 IMPLANT
TROCAR BLADELESS OPT 5 100 (ENDOMECHANICALS) ×3 IMPLANT
TUBING CONNECTING 10 (TUBING) ×2 IMPLANT
TUBING CONNECTING 10' (TUBING) ×1

## 2019-10-07 NOTE — Transfer of Care (Signed)
Immediate Anesthesia Transfer of Care Note  Patient: Steve Love  Procedure(s) Performed: LAPAROSCOPIC GASTRIC SLEEVE RESECTION, Upper Endo, ERAS Pathway (N/A Abdomen)  Patient Location: PACU  Anesthesia Type:General  Level of Consciousness: awake, alert , oriented and patient cooperative  Airway & Oxygen Therapy: Patient Spontanous Breathing and Patient connected to face mask oxygen  Post-op Assessment: Report given to RN, Post -op Vital signs reviewed and stable and Patient moving all extremities  Post vital signs: Reviewed and stable  Last Vitals:  Vitals Value Taken Time  BP 184/110 10/07/19 0858  Temp    Pulse 90 10/07/19 0858  Resp 21 10/07/19 0858  SpO2 100 % 10/07/19 0858  Vitals shown include unvalidated device data.  Last Pain:  Vitals:   10/07/19 0541  TempSrc: Oral  PainSc: 0-No pain         Complications: No apparent anesthesia complications

## 2019-10-07 NOTE — H&P (Signed)
Steve Love is an 28 y.o. male.   Chief Complaint: obesity HPI: 28 yo male with long history of advanced obesity has completed all requirements for surgery. He presents for bariatric surgery.  Past Medical History:  Diagnosis Date  . Asthma   . Sleep apnea     Past Surgical History:  Procedure Laterality Date  . TONSILLECTOMY      Family History  Problem Relation Age of Onset  . Cancer Other   . Hypertension Mother   . Prostate cancer Father    Social History:  reports that he has never smoked. He has never used smokeless tobacco. He reports that he does not drink alcohol or use drugs.  Allergies: No Known Allergies  Medications Prior to Admission  Medication Sig Dispense Refill  . calcium carbonate (TUMS - DOSED IN MG ELEMENTAL CALCIUM) 500 MG chewable tablet Chew 2 tablets by mouth daily as needed for indigestion or heartburn.      No results found for this or any previous visit (from the past 48 hour(s)). No results found.  Review of Systems  Constitutional: Negative for chills and fever.  HENT: Negative for hearing loss.   Respiratory: Negative for cough.   Cardiovascular: Negative for chest pain and palpitations.  Gastrointestinal: Negative for abdominal pain, nausea and vomiting.  Genitourinary: Negative for dysuria and urgency.  Musculoskeletal: Negative for myalgias and neck pain.  Skin: Negative for rash.  Neurological: Negative for dizziness and headaches.  Hematological: Does not bruise/bleed easily.  Psychiatric/Behavioral: Negative for suicidal ideas.    Blood pressure (!) 168/98, pulse 89, temperature 98.8 F (37.1 C), temperature source Oral, resp. rate 18, height 6\' 2"  (1.88 m), weight (!) 206.9 kg, SpO2 99 %. Physical Exam  Nursing note and vitals reviewed. Constitutional: He is oriented to person, place, and time. He appears well-developed and well-nourished.  HENT:  Head: Normocephalic and atraumatic.  Eyes: Conjunctivae and EOM are  normal. No scleral icterus.  Cardiovascular: Normal rate and regular rhythm.  Respiratory: Effort normal and breath sounds normal. He has no wheezes. He has no rales. He exhibits no tenderness.  GI: Soft. He exhibits no distension. There is no abdominal tenderness. There is no rebound.  Musculoskeletal:        General: No edema. Normal range of motion.     Cervical back: Normal range of motion and neck supple.  Neurological: He is alert and oriented to person, place, and time.  Skin: Skin is warm and dry.  Psychiatric: He has a normal mood and affect. His behavior is normal.     Assessment/Plan 28 yo male with class III obesity -lap sleeve gastrectomy -ERAS and bariatric protocols  Mickeal Skinner, MD 10/07/2019, 7:09 AM

## 2019-10-07 NOTE — Progress Notes (Signed)
Ice chips and water started at 1300.

## 2019-10-07 NOTE — Anesthesia Preprocedure Evaluation (Addendum)
Anesthesia Evaluation  Patient identified by MRN, date of birth, ID band Patient awake    Reviewed: Allergy & Precautions, NPO status , Patient's Chart, lab work & pertinent test results  History of Anesthesia Complications Negative for: history of anesthetic complications  Airway Mallampati: II  TM Distance: >3 FB Neck ROM: Full    Dental  (+) Dental Advisory Given   Pulmonary asthma , sleep apnea ,    breath sounds clear to auscultation       Cardiovascular negative cardio ROS   Rhythm:Regular     Neuro/Psych negative neurological ROS  negative psych ROS   GI/Hepatic negative GI ROS, Neg liver ROS,   Endo/Other  neg diabetesMorbid obesity  Renal/GU negative Renal ROS     Musculoskeletal negative musculoskeletal ROS (+)   Abdominal   Peds  Hematology negative hematology ROS (+)   Anesthesia Other Findings   Reproductive/Obstetrics                            Anesthesia Physical Anesthesia Plan  ASA: III  Anesthesia Plan: General   Post-op Pain Management:    Induction: Intravenous  PONV Risk Score and Plan: 2 and Ondansetron and Dexamethasone  Airway Management Planned: Oral ETT  Additional Equipment: None  Intra-op Plan:   Post-operative Plan: Extubation in OR  Informed Consent: I have reviewed the patients History and Physical, chart, labs and discussed the procedure including the risks, benefits and alternatives for the proposed anesthesia with the patient or authorized representative who has indicated his/her understanding and acceptance.     Dental advisory given  Plan Discussed with: CRNA and Surgeon  Anesthesia Plan Comments:         Anesthesia Quick Evaluation

## 2019-10-07 NOTE — Op Note (Signed)
Preop Diagnosis: Obesity Class III  Postop Diagnosis: same  Procedure performed: laparoscopic Sleeve Gastrectomy  Assitant: Kaylyn Lim  Indications:  The patient is a 28 y.o. year-old morbidly obese male who has been followed in the Bariatric Clinic as an outpatient. This patient was diagnosed with morbid obesity with a BMI of Body mass index is 58.57 kg/m. and significant co-morbidities including hypertension.  The patient was counseled extensively in the Bariatric Outpatient Clinic and after a thorough explanation of the risks and benefits of surgery (including death from complications, bowel leak, infection such as peritonitis and/or sepsis, internal hernia, bleeding, need for blood transfusion, bowel obstruction, organ failure, pulmonary embolus, deep venous thrombosis, wound infection, incisional hernia, skin breakdown, and others entailed on the consent form) and after a compliant diet and exercise program, the patient was scheduled for an elective laparoscopic sleeve gastrectomy.  Description of Operation:  Following informed consent, the patient was taken to the operating room and placed on the operating table in the supine position.  He had previously received prophylactic antibiotics and subcutaneous heparin for DVT prophylaxis in the pre-op holding area.  After induction of general endotracheal anesthesia by the anesthesiologist, the patient underwent placement of sequential compression devices and an oro-gastric tube.  A timeout was confirmed by the surgery and anesthesia teams.  The patient was adequately padded at all pressure points and placed on a footboard to prevent slippage from the OR table during extremes of position during surgery.  He underwent a routine sterile prep and drape of her entire abdomen.    Next, A transverse incision was made under the left subcostal area and a 24mm optical viewing trocar was introduced into the peritoneal cavity. Pneumoperitoneum was applied with a  high flow and low pressure. A laparoscope was inserted to confirm placement. A extraperitoneal block was then placed at the lateral abdominal wall using exparel diluted with marcaine. 5 additional incisions were placed: 1 27mm trocar to the left of the midline. 1 additional 61mm trocar in the left lateral area, 1 34mm trocar in the right mid abdomen, 1 28mm trocar in the right subcostal area, and a Nathanson retractor was placed through a subxiphoid incision.  The fat pad at the GE junction was incised and the gastrodiaphragmatic ligament was divided using the Harmonic scalpel. Next, a hole was created through the lesser omentum along the greater curve of the stomach to enter the lesser sac. The vessels along the greater omentum were  Then ligated and divided using the Harmonic scalpel moving towards the spleen and then short gastric vessels were ligated and divided in the same fashion to fully mobilize the fundus. The left crus was identified to ensure completion of the dissection. Next the antrum was measured and dissection continued inferiorly along the greater curve towards the pylorus and stopped 6cm from the pylorus.   A 40Fr ViSiGi dilator was placed into the esophgaus and along the lesser curve of the stomach and placed on suction. 1 non-reinforced 85mm Green load echelon stapler(s) followed by 1 78mm Gold load echelon stapler(s) followed by 4 7mm blue load echelon stapler(s) were used to make the resection along the antrum being sure to stay well away from the angularis by angling the jaws of the stapler towards the greater curve and later completing the resection staying along the [place] and ensuring the fundus was not retained by appropriately retracting it lateral. Air was inserted through the [place] to perform a leak test showing no bubbles and a neutral lie  of the stomach.  The assistant then went and performed an upper endoscopy and leak test. No bubbles were seen and the sleeve and antrum  distended appropriately. The specimen was then placed in an endocatch bag and removed by the 3mm port. The fascia of the 4mm port was closed with a 0 vicryl by suture passer. Hemostasis was ensured. Pneumoperitoneum was evacuated, all ports were removed and all incisions closed with 4-0 monocryl suture in subcuticular fashion. Steristrips and bandaids were put in place for dressing. The patient awoke from anesthesia and was brought to pacu in stable condition. All counts were correct.  Estimated blood loss: <37ml  Specimens:  Sleeve gastrectomy  Local Anesthesia: 50 ml Exparel:0.5% Marcaine mix  Post-Op Plan:       Pain Management: PO, prn      Antibiotics: Prophylactic      Anticoagulation: Prophylactic, Starting now      Post Op Studies/Consults: Not applicable      Intended Discharge: within 48h      Intended Outpatient Follow-Up: Two Week      Intended Outpatient Studies: Not Applicable      Other: Not Applicable  De [place]anch

## 2019-10-07 NOTE — Progress Notes (Signed)
PHARMACY CONSULT FOR:  Risk Assessment for Post-Discharge VTE Following Bariatric Surgery  Post-Discharge VTE Risk Assessment: This patient's probability of 30-day post-discharge VTE is increased due to the factors marked: X  Male    Age >/=60 years  X  BMI >/=50 kg/m2    CHF    Dyspnea at Rest    Paraplegia   X Non-gastric-band surgery    Operation Time >/=3 hr    Return to OR     Length of Stay >/= 3 d      Hx of VTE   Hypercoagulable condition   Significant venous stasis   Predicted probability of 30-day post-discharge VTE: - 0.51%   Other patient-specific factors to consider:   Recommendation for Discharge: Enoxaparin 60 mg Pinch q12h x 2 weeks post-discharge       Steve Love is a 28 y.o. male who underwent  LAPAROSCOPIC GASTRIC SLEEVE RESECTION  on 10/07/19     Case start: 0750 Case end: 0840   No Known Allergies  Patient Measurements: Height: 6\' 2"  (188 cm) Weight: (!) 456 lb 3.2 oz (206.9 kg) IBW/kg (Calculated) : 82.2 Body mass index is 58.57 kg/m.  Recent Labs    10/07/19 0919  HGB 14.2  HCT 46.9   Estimated Creatinine Clearance: 203.9 mL/min (by C-G formula based on SCr of 0.99 mg/dL).    Past Medical History:  Diagnosis Date  . Asthma   . Sleep apnea      Medications Prior to Admission  Medication Sig Dispense Refill Last Dose  . calcium carbonate (TUMS - DOSED IN MG ELEMENTAL CALCIUM) 500 MG chewable tablet Chew 2 tablets by mouth daily as needed for indigestion or heartburn.   More than a month at Unknown time        Royetta Asal, PharmD, BCPS 10/07/2019 1:31 PM

## 2019-10-07 NOTE — Progress Notes (Signed)
Discussed patient elevated blood pressure with Dr Kieth Brightly. 182/103 after receiving 5 mg IV Lopressor.  Orders received to start oral Amlodipine 5 mg now.  Order placed and discussed with bedside RN Lavonia Drafts.

## 2019-10-07 NOTE — Progress Notes (Signed)
BSTOP education provided including BSTOP information guide, "Guide for Pain Management after your Bariatric Procedure".  Questions answered.

## 2019-10-07 NOTE — Anesthesia Procedure Notes (Signed)

## 2019-10-07 NOTE — Op Note (Signed)
Steve Love [id number] Mar 06, 1991 10/07/2019  Preoperative diagnosis: morbid obesity  Postoperative diagnosis: Same   Procedure: Upper endoscopy   Surgeon: Catalina Antigua B. Hassell Done  M.D., FACS   Anesthesia: Gen.   Indications for procedure: This patient was undergoing a sleeve gastrectomy by Dr. Kieth Brightly. Endoscopy performed to assess geometry of the sleeve and to check for bleeding or leaks.    Description of procedure: The endoscopy was placed in the mouth and into the oropharynx and under endoscopic vision it was advanced to the esophagogastric junction.  The pouch was insufflated and there was a cylindrical sleeve with no bleeding noted in the lumen.  The pylorus was identified.  .   No bleeding or leaks were detected.  The scope was withdrawn without difficulty.     Matt B. Hassell Done, MD, FACS General, Bariatric, & Minimally Invasive Surgery [facility], [place]

## 2019-10-08 ENCOUNTER — Encounter: Payer: Self-pay | Admitting: *Deleted

## 2019-10-08 LAB — CBC WITH DIFFERENTIAL/PLATELET
Abs Immature Granulocytes: 0.05 10*3/uL (ref 0.00–0.07)
Basophils Absolute: 0 10*3/uL (ref 0.0–0.1)
Basophils Relative: 0 %
Eosinophils Absolute: 0 10*3/uL (ref 0.0–0.5)
Eosinophils Relative: 0 %
HCT: 45.7 % (ref 39.0–52.0)
Hemoglobin: 14.1 g/dL (ref 13.0–17.0)
Immature Granulocytes: 0 %
Lymphocytes Relative: 12 %
Lymphs Abs: 1.6 10*3/uL (ref 0.7–4.0)
MCH: 26.8 pg (ref 26.0–34.0)
MCHC: 30.9 g/dL (ref 30.0–36.0)
MCV: 86.7 fL (ref 80.0–100.0)
Monocytes Absolute: 0.8 10*3/uL (ref 0.1–1.0)
Monocytes Relative: 6 %
Neutro Abs: 10.3 10*3/uL — ABNORMAL HIGH (ref 1.7–7.7)
Neutrophils Relative %: 82 %
Platelets: 259 10*3/uL (ref 150–400)
RBC: 5.27 MIL/uL (ref 4.22–5.81)
RDW: 12.6 % (ref 11.5–15.5)
WBC: 12.7 10*3/uL — ABNORMAL HIGH (ref 4.0–10.5)
nRBC: 0 % (ref 0.0–0.2)

## 2019-10-08 LAB — COMPREHENSIVE METABOLIC PANEL
ALT: 37 U/L (ref 0–44)
AST: 22 U/L (ref 15–41)
Albumin: 3.8 g/dL (ref 3.5–5.0)
Alkaline Phosphatase: 37 U/L — ABNORMAL LOW (ref 38–126)
Anion gap: 7 (ref 5–15)
BUN: 8 mg/dL (ref 6–20)
CO2: 28 mmol/L (ref 22–32)
Calcium: 9.2 mg/dL (ref 8.9–10.3)
Chloride: 103 mmol/L (ref 98–111)
Creatinine, Ser: 0.83 mg/dL (ref 0.61–1.24)
GFR calc Af Amer: >60 mL/min (ref >60)
GFR calc non Af Amer: >60 mL/min (ref >60)
Glucose, Bld: 130 mg/dL — ABNORMAL HIGH (ref 70–99)
Potassium: 3.9 mmol/L (ref 3.5–5.1)
Sodium: 138 mmol/L (ref 135–145)
Total Bilirubin: 1 mg/dL (ref 0.3–1.2)
Total Protein: 7 g/dL (ref 6.5–8.1)

## 2019-10-08 LAB — SURGICAL PATHOLOGY

## 2019-10-08 MED ORDER — GABAPENTIN 100 MG PO CAPS: 200.0000 mg | ORAL_CAPSULE | Freq: Two times a day (BID) | ORAL | 0 refills | Status: AC

## 2019-10-08 MED ORDER — PANTOPRAZOLE SODIUM 40 MG PO TBEC: 40.0000 mg | DELAYED_RELEASE_TABLET | Freq: Every day | ORAL | 0 refills | Status: AC

## 2019-10-08 MED ORDER — ENOXAPARIN SODIUM 60 MG/0.6ML ~~LOC~~ SOLN: 60.0000 mg | Freq: Two times a day (BID) | SUBCUTANEOUS | 0 refills | Status: AC

## 2019-10-08 MED ORDER — ACETAMINOPHEN 500 MG PO TABS: 1000.0000 mg | ORAL_TABLET | Freq: Three times a day (TID) | ORAL | 0 refills | Status: AC

## 2019-10-08 MED ORDER — ONDANSETRON 4 MG PO TBDP: 4.0000 mg | ORAL_TABLET | Freq: Four times a day (QID) | ORAL | 0 refills | Status: AC | PRN

## 2019-10-08 NOTE — Progress Notes (Signed)
Pt completed 12 oz of water,tolerating well. No c/o any nausea or vomiting. Started protein at 0200.

## 2019-10-08 NOTE — Progress Notes (Signed)
Patient alert and oriented, Post op day 1. Provided support and encouragement.  Encouraged pulmonary toilet, ambulation and small sips of liquids.  Completed 12 ounces of bari clear fluid, 4 ounces of protein but states thick.  Discussed chicken soup unjury as option as it is thinner. All questions answered.  Will continue to monitor.

## 2019-10-08 NOTE — Discharge Summary (Signed)
Physician Discharge Summary  Steve Love WGN:[id number] DOB: 02/01/1991 DOA: 10/07/2019  PCP: System, Pcp Not In  Admit date: 10/07/2019 Discharge date: 10/08/2019  Recommendations for Outpatient Follow-up:  1.  (include homehealth, outpatient follow-up instructions, specific recommendations for PCP to follow-up on, etc.)  Follow-up Information    Claudie Rathbone, Arta Bruce, MD. Go on 10/25/2019.   Specialty: General Surgery Why: at 10 am Contact information: [place] [address] [phone]        Surgery, [place]. Go on 11/29/2019.   Specialty: General Surgery Why: at [address] information: [place] [address] [phone]          Discharge Diagnoses:  Active Problems:   Morbid obesity ([place])   Surgical Procedure: laparoscopic sleeve gastrectomy, upper endoscopy  Discharge Condition: Good Disposition: Home  Diet recommendation: Postoperative sleeve gastrectomy diet (liquids only)  Filed Weights   10/07/19 0541  Weight: (!) 206.9 kg     Hospital Course:  The patient was admitted after undergoing laparoscopic sleeve gastrectomy. POD 0 he ambulated well. POD 1 he was started on the water diet protocol and tolerated 500 ml in the first shift. Once meeting the water amount he was advanced to bariatric protein shakes which they tolerated and were discharged home POD 1.  Treatments: surgery: laparoscopic sleeve gastrectomy  Discharge Instructions  Discharge Instructions    Ambulate hourly while awake   Complete by: As directed    Call MD for:  difficulty breathing, headache or visual disturbances   Complete by: As directed    Call MD for:  persistant dizziness or light-headedness   Complete by: As directed    Call MD for:  persistant nausea and vomiting   Complete by: As directed    Call MD for:  redness, tenderness, or signs of infection (pain, swelling, redness, odor or green/yellow  discharge around incision site)   Complete by: As directed    Call MD for:  severe uncontrolled pain   Complete by: As directed    Call MD for:  temperature >101 F   Complete by: As directed    Diet bariatric full liquid   Complete by: As directed    Discharge wound care:   Complete by: As directed    Remove Bandaids tomorrow, ok to shower tomorrow. Steristrips may fall off in 1-3 weeks.   Incentive spirometry   Complete by: As directed    Perform hourly while awake     Allergies as of 10/08/2019   No Known Allergies     Medication List    TAKE these medications   acetaminophen 500 MG tablet Commonly known as: TYLENOL Take 2 tablets (1,000 mg total) by mouth every 8 (eight) hours for 5 days.   calcium carbonate 500 MG chewable tablet Commonly known as: TUMS - dosed in mg elemental calcium Chew 2 tablets by mouth daily as needed for indigestion or heartburn.   enoxaparin 60 MG/0.6ML injection Commonly known as: LOVENOX Inject 0.6 mLs (60 mg total) into the skin every 12 (twelve) hours for 14 days.   gabapentin 100 MG capsule Commonly known as: NEURONTIN Take 2 capsules (200 mg total) by mouth every 12 (twelve) hours.   ondansetron 4 MG disintegrating tablet Commonly known as: ZOFRAN-ODT Take 1 tablet (4 mg total) by mouth every 6 (six) hours as needed for nausea or vomiting.   pantoprazole 40 MG tablet Commonly known as: PROTONIX Take 1 tablet (40 mg total)  by mouth daily.            Discharge Care Instructions  (From admission, onward)         Start     Ordered   10/08/19 0000  Discharge wound care:    Comments: Remove Bandaids tomorrow, ok to shower tomorrow. Steristrips may fall off in 1-3 weeks.   10/08/19 1245         Follow-up Information    Devonia Farro, De Blanch, MD. Go on 10/25/2019.   Specialty: General Surgery Why: at 10 am Contact information: [address] [place] [address] [phone]        Surgery, [place]. Go on 11/29/2019.   Specialty: General Surgery Why: at [address] Contact information: [address] [place] [address] [phone]            The results of significant diagnostics from this hospitalization (including imaging, microbiology, ancillary and laboratory) are listed below for reference.    Significant Diagnostic Studies: No results found.  Labs: Basic Metabolic Panel: Recent Labs  Lab 10/04/19 1215 10/08/19 0503  NA 140 138  K 3.7 3.9  CL 105 103  CO2 26 28  GLUCOSE 105* 130*  BUN 10 8  CREATININE 0.99 0.83  CALCIUM 9.2 9.2   Liver Function Tests: Recent Labs  Lab 10/04/19 1215 10/08/19 0503  AST 21 22  ALT 35 37  ALKPHOS 38 37*  BILITOT 0.6 1.0  PROT 7.4 7.0  ALBUMIN 3.7 3.8    CBC: Recent Labs  Lab 10/04/19 1215 10/07/19 0919 10/08/19 0503  WBC 7.2  --  12.7*  NEUTROABS 4.4  --  10.3*  HGB 14.2 14.2 14.1  HCT 46.2 46.9 45.7  MCV 87.8  --  86.7  PLT 256  --  259    CBG: No results for input(s): GLUCAP in the last 168 hours.  Active Problems:   Morbid obesity (HCC)   VTE plan: I will prescribe outpatient chemical prophylaxis of enoxaparin due to this increased risk ([email or web address])  Time coordinating discharge: 15 min

## 2019-10-08 NOTE — Discharge Instructions (Signed)
Enoxaparin injection What is this medicine? ENOXAPARIN (ee nox a PA rin) is used after knee, hip, or abdominal surgeries to prevent blood clotting. It is also used to treat existing blood clots in the lungs or in the veins. This medicine may be used for other purposes; ask your health care provider or pharmacist if you have questions. COMMON BRAND NAME(S): Lovenox What should I tell my health care provider before I take this medicine? They need to know if you have any of these conditions:  bleeding disorders, hemorrhage, or hemophilia  infection of the heart or heart valves  kidney or liver disease  previous stroke  prosthetic heart valve  recent surgery or delivery of a baby  ulcer in the stomach or intestine, diverticulitis, or other bowel disease  an unusual or allergic reaction to enoxaparin, heparin, pork or pork products, other medicines, foods, dyes, or preservatives  pregnant or trying to get pregnant  breast-feeding How should I use this medicine? This medicine is for injection under the skin. It is usually given by a health-care professional. You or a family member may be trained on how to give the injections. If you are to give yourself injections, make sure you understand how to use the syringe, measure the dose if necessary, and give the injection. To avoid bruising, do not rub the site where this medicine has been injected. Do not take your medicine more often than directed. Do not stop taking except on the advice of your doctor or health care professional. Make sure you receive a puncture-resistant container to dispose of the needles and syringes once you have finished with them. Do not reuse these items. Return the container to your doctor or health care professional for proper disposal. Talk to your pediatrician regarding the use of this medicine in children. Special care may be needed. Overdosage: If you think you have taken too much of this medicine contact a poison  control center or emergency room at once. NOTE: This medicine is only for you. Do not share this medicine with others. What if I miss a dose? If you miss a dose, take it as soon as you can. If it is almost time for your next dose, take only that dose. Do not take double or extra doses. What may interact with this medicine?  aspirin and aspirin-like medicines  certain medicines that treat or prevent blood clots  dipyridamole  NSAIDs, medicines for pain and inflammation, like ibuprofen or naproxen This list may not describe all possible interactions. Give your health care provider a list of all the medicines, herbs, non-prescription drugs, or dietary supplements you use. Also tell them if you smoke, drink alcohol, or use illegal drugs. Some items may interact with your medicine. What should I watch for while using this medicine? Visit your healthcare professional for regular checks on your progress. You may need blood work done while you are taking this medicine. Your condition will be monitored carefully while you are receiving this medicine. It is important not to miss any appointments. If you are going to need surgery or other procedure, tell your healthcare professional that you are using this medicine. Using this medicine for a long time may weaken your bones and increase the risk of bone fractures. Avoid sports and activities that might cause injury while you are using this medicine. Severe falls or injuries can cause unseen bleeding. Be careful when using sharp tools or knives. Consider using an electric razor. Take special care brushing or flossing your   teeth. Report any injuries, bruising, or red spots on the skin to your healthcare professional. Wear a medical ID bracelet or chain. Carry a card that describes your disease and details of your medicine and dosage times. What side effects may I notice from receiving this medicine? Side effects that you should report to your doctor or health  care professional as soon as possible:  allergic reactions like skin rash, itching or hives, swelling of the face, lips, or tongue  bone pain  signs and symptoms of bleeding such as bloody or black, tarry stools; red or dark-brown urine; spitting up blood or brown material that looks like coffee grounds; red spots on the skin; unusual bruising or bleeding from the eye, gums, or nose  signs and symptoms of a blood clot such as chest pain; shortness of breath; pain, swelling, or warmth in the leg  signs and symptoms of a stroke such as changes in vision; confusion; trouble speaking or understanding; severe headaches; sudden numbness or weakness of the face, arm or leg; trouble walking; dizziness; loss of coordination Side effects that usually do not require medical attention (report to your doctor or health care professional if they continue or are bothersome):  hair loss  pain, redness, or irritation at site where injected This list may not describe all possible side effects. Call your doctor for medical advice about side effects. You may report side effects to FDA at 1-800-FDA-1088. Where should I keep my medicine? Keep out of the reach of children. Store at room temperature between 15 and 30 degrees C (59 and 86 degrees F). Do not freeze. If your injections have been specially prepared, you may need to store them in the refrigerator. Ask your pharmacist. Throw away any unused medicine after the expiration date. NOTE: This sheet is a summary. It may not cover all possible information. If you have questions about this medicine, talk to your doctor, pharmacist, or health care provider.  2020 Elsevier/Gold Standard (2017-09-28 11:25:34)     GASTRIC BYPASS/SLEEVE  Home Care Instructions   These instructions are to help you care for yourself when you go home.  Call: If you have any problems. . Call [phone] and ask for the surgeon on call . If you need immediate help, come to the ER  at [facility].  . Tell the ER staff that you are a new post-op gastric bypass or gastric sleeve patient   Signs and symptoms to report: . Severe vomiting or nausea o If you cannot keep down clear liquids for longer than 1 day, call your surgeon  . Abdominal pain that does not get better after taking your pain medication . Fever over 100.4 F with chills . Heart beating over 100 beats a minute . Shortness of breath at rest . Chest pain .  Redness, swelling, drainage, or foul odor at incision (surgical) sites .  If your incisions open or pull apart . Swelling or pain in calf (lower leg) . Diarrhea (Loose bowel movements that happen often), frequent watery, uncontrolled bowel movements . Constipation, (no bowel movements for 3 days) if this happens: Pick one o Milk of Magnesia, 2 tablespoons by mouth, 3 times a day for 2 days if needed o Stop taking Milk of Magnesia once you have a bowel movement o Call your doctor if constipation continues Or o Miralax  (instead of Milk of Magnesia) following the label instructions o Stop taking Miralax once you have a bowel movement o Call your doctor if constipation   continues . Anything you think is not normal   Normal side effects after surgery: . Unable to sleep at night or unable to focus . Irritability or moody . Being tearful (crying) or depressed These are common complaints, possibly related to your anesthesia medications that put you to sleep, stress of surgery, and change in lifestyle.  This usually goes away a few weeks after surgery.  If these feelings continue, call your primary care doctor.   Wound Care: You may have surgical glue, steri-strips, or staples over your incisions after surgery . Surgical glue:  Looks like a clear film over your incisions and will wear off a little at a time . Steri-strips: Strips of tape over your incisions. You may notice a yellowish color on the skin under the steri-strips. This is used to make the    steri-strips stick better. Do not pull the steri-strips off - let them fall off . Staples: Staples may be removed before you leave the hospital o If you go home with staples, call [facility], [phone] at for an appointment with your surgeon's nurse to have staples removed 10 days after surgery. . Showering: You may shower two (2) days after your surgery unless your surgeon tells you differently o Wash gently around incisions with warm soapy water, rinse well, and gently pat dry  o No tub baths until staples are removed, steri-strips fall off or glue is gone.    Medications: . Medications should be liquid or crushed if larger than the size of a dime . Extended release pills (medication that release a little bit at a time through the day) should NOT be crushed or cut. (examples include XL, ER, DR, SR) . Depending on the size and number of medications you take, you may need to space (take a few throughout the day)/change the time you take your medications so that you do not over-fill your pouch (smaller stomach) . Make sure you follow-up with your primary care doctor to make medication changes needed during rapid weight loss and life-style changes . If you have diabetes, follow up with the doctor that orders your diabetes medication(s) within one week after surgery and check your blood sugar regularly. . Do not drive while taking prescription pain medication  . It is ok to take Tylenol by the bottle instructions with your pain medicine or instead of your pain medicine as needed.  DO NOT TAKE NSAIDS (EXAMPLES OF NSAIDS:  IBUPROFREN/ NAPROXEN)  Diet:                    First 2 Weeks  You will see the dietician t about two (2) weeks after your surgery. The dietician will increase the types of foods you can eat if you are handling liquids well: . If you have severe vomiting or nausea and cannot keep down clear liquids lasting longer than 1 day, call your surgeon @ ([phone]) Protein  Shake . Drink at least 2 ounces of shake 5-6 times per day . Each serving of protein shakes (usually 8 - 12 ounces) should have: o 15 grams of protein  o And no more than 5 grams of carbohydrate  . Goal for protein each day: o Men = 80 grams per day o Women = 60 grams per day . Protein powder may be added to fluids such as non-fat milk or Lactaid milk or unsweetened Soy/Almond milk (limit to 35 grams added protein powder per serving)  Hydration . Slowly increase the   amount of water and other clear liquids as tolerated (See Acceptable Fluids) . Slowly increase the amount of protein shake as tolerated  .  Sip fluids slowly and throughout the day.  Do not use straws. . May use sugar substitutes in small amounts (no more than 6 - 8 packets per day; i.e. Splenda)  Fluid Goal . The first goal is to drink at least 8 ounces of protein shake/drink per day (or as directed by the nutritionist); some examples of protein shakes are Syntrax Nectar, Adkins Advantage, EAS Edge HP, and Unjury. See handout from pre-op Bariatric Education Class: o Slowly increase the amount of protein shake you drink as tolerated o You may find it easier to slowly sip shakes throughout the day o It is important to get your proteins in first . Your fluid goal is to drink 64 - 100 ounces of fluid daily o It may take a few weeks to build up to this . 32 oz (or more) should be clear liquids  And  . 32 oz (or more) should be full liquids (see below for examples) . Liquids should not contain sugar, caffeine, or carbonation  Clear Liquids: . Water or Sugar-free flavored water (i.e. Fruit H2O, Propel) . Decaffeinated coffee or tea (sugar-free) . Crystal Lite, Wyler's Lite, Minute Maid Lite . Sugar-free Jell-O . Bouillon or broth . Sugar-free Popsicle:   *Less than 20 calories each; Limit 1 per day  Full Liquids: Protein Shakes/Drinks + 2 choices per day of other full liquids . Full liquids must be: o No More Than 15  grams of Carbs per serving  o No More Than 3 grams of Fat per serving . Strained low-fat cream soup (except Cream of Potato or Tomato) . Non-Fat milk . Fat-free Lactaid Milk . Unsweetened Soy Or Unsweetened Almond Milk . Low Sugar yogurt (Dannon Lite & Fit, Greek yogurt; Oikos Triple Zero; Chobani Simply 100; Yoplait 100 calorie Greek - No Fruit on the Bottom)    Vitamins and Minerals . Start 1 day after surgery unless otherwise directed by your surgeon . 2 Chewable Bariatric Specific Multivitamin / Multimineral Supplement with iron (Example: Bariatric Advantage Multi EA) . Chewable Calcium with Vitamin D-3 (Example: 3 Chewable Calcium Plus 600 with Vitamin D-3) o Take 500 mg three (3) times a day for a total of 1500 mg each day o Do not take all 3 doses of calcium at one time as it may cause constipation, and you can only absorb 500 mg  at a time  o Do not mix multivitamins containing iron with calcium supplements; take 2 hours apart . Menstruating women and those with a history of anemia (a blood disease that causes weakness) may need extra iron o Talk with your doctor to see if you need more iron . Do not stop taking or change any vitamins or minerals until you talk to your dietitian or surgeon . Your Dietitian and/or surgeon must approve all vitamin and mineral supplements   Activity and Exercise: Limit your physical activity as instructed by your doctor.  It is important to continue walking at home.  During this time, use these guidelines: . Do not lift anything greater than ten (10) pounds for at least two (2) weeks . Do not go back to work or drive until your surgeon says you can . You may have sex when you feel comfortable  o It is VERY important for male patients to use a reliable birth control method; fertility often increases after   surgery  o All hormonal birth control will be ineffective for 30 days after surgery due to medications given during surgery a barrier method must be  used. o Do not get pregnant for at least 18 months . Start exercising as soon as your doctor tells you that you can o Make sure your doctor approves any physical activity . Start with a simple walking program . Walk 5-15 minutes each day, 7 days per week.  . Slowly increase until you are walking 30-45 minutes per day Consider joining our BELT program. [phone] or email [email or web address]   Special Instructions Things to remember: . Use your CPAP when sleeping if this applies to you  . [facility] has two free Bariatric Surgery Support Groups that meet monthly o The 3rd Thursday of each month, 6 pm, [facility]  o The 2nd Friday of each month, 11:45 am in the private dining room in the basement of [facility] . It is very important to keep all follow up appointments with your surgeon, dietitian, primary care physician, and behavioral health practitioner . Routine follow up schedule with your surgeon include appointments at 2-3 weeks, 6-8 weeks, 6 months, and 1 year at a minimum.  Your surgeon may request to see you more often.   o After the first year, please follow up with your bariatric surgeon and dietitian at least once a year in order to maintain best weight loss results [facility]: [phone] [facility]: [phone] Bariatric Nurse Coordinator: [phone]      Reviewed and Endorsed  by [facility] Patient Education Committee, June, 2016 Edits Approved: Aug, 2018    

## 2019-10-08 NOTE — Anesthesia Postprocedure Evaluation (Signed)
Anesthesia Post Note  Patient: Steve Love  Procedure(s) Performed: LAPAROSCOPIC GASTRIC SLEEVE RESECTION, Upper Endo, ERAS Pathway (N/A Abdomen)     Patient location during evaluation: PACU Anesthesia Type: General Level of consciousness: awake and alert Pain management: pain level controlled Vital Signs Assessment: post-procedure vital signs reviewed and stable Respiratory status: spontaneous breathing, nonlabored ventilation, respiratory function stable and patient connected to nasal cannula oxygen Cardiovascular status: blood pressure returned to baseline and stable Postop Assessment: no apparent nausea or vomiting Anesthetic complications: no    Last Vitals:  Vitals:   10/08/19 0610 10/08/19 0918  BP: (!) 162/93 136/67  Pulse: 75 78  Resp: 19 16  Temp: 36.8 C 36.5 C  SpO2: 97% 95%    Last Pain:  Vitals:   10/08/19 0918  TempSrc: Oral  PainSc:                  [name]

## 2019-10-08 NOTE — Progress Notes (Signed)
Patient alert and oriented, pain is controlled. Patient is tolerating fluids, advanced to protein shake today, patient is tolerating well.  Reviewed Gastric sleeve discharge instructions with patient and patient is able to articulate understanding.  Provided information on BELT program, Support Group and [facility] outpatient pharmacy. All questions answered, will continue to monitor.  Total fluid intake 720 Per dehydration protocol call back one week post op 

## 2019-10-08 NOTE — Progress Notes (Signed)
Discharge instructions discussed with patient and family, verbalized agreement and understanding 

## 2019-10-14 ENCOUNTER — Telehealth (HOSPITAL_COMMUNITY): Payer: Self-pay

## 2019-10-14 NOTE — Telephone Encounter (Signed)
Patient called to discuss post bariatric surgery follow up questions.  See below:   1.  Tell me about your pain and pain management?denies  2.  Let's talk about fluid intake.  How much total fluid are you taking in?80 ounces  3.  How much protein have you taken in the last 2 days? 90-100 grams  4.  Have you had nausea?  Tell me about when have experienced nausea and what you did to help? denies  5.  Has the frequency or color changed with your urine? Light in color  6.  Tell me what your incisions look like?no problems  7.  Have you been passing gas? BM?had bm no problem  8.  If a problem or question were to arise who would you call?  Do you know contact numbers for [facility], [facility], and [facility]?aware of how to contact all services  9.  How has the walking going?walking regularly  10.  How are your vitamins and calcium going?  How are you taking them?mvi and calcium started,no questions  lovenox shots twice a day

## 2019-10-22 ENCOUNTER — Other Ambulatory Visit: Payer: Self-pay

## 2019-10-22 ENCOUNTER — Encounter: Payer: Managed Care, Other (non HMO) | Attending: General Surgery | Admitting: Skilled Nursing Facility1

## 2019-10-22 DIAGNOSIS — E662 Morbid (severe) obesity with alveolar hypoventilation: Secondary | ICD-10-CM | POA: Diagnosis not present

## 2019-10-22 DIAGNOSIS — Z6841 Body Mass Index (BMI) 40.0 and over, adult: Secondary | ICD-10-CM | POA: Insufficient documentation

## 2019-10-23 NOTE — Progress Notes (Signed)
2 Week Post-Operative Nutrition Class   Patient was seen on 10/22/2019 for Post-Operative Nutrition education at [facility].    Surgery date: 10/07/2019 Surgery type: sleeve [facility] weight at [facility]: 477.3 Weight today: 435.7   Body Composition Scale declined  Total Body Fat %   Visceral Fat   Fat-Free Mass %    Total Body Water %    Muscle-Mass lbs   Body Fat Displacement          Torso  lbs          Left Leg  lbs          Right Leg  lbs          Left Arm  lbs          Right Arm   lbs      The following the learning objectives were met by the patient during this course:  Identifies Phase 3 (Soft, High Proteins) Dietary Goals and will begin from 2 weeks post-operatively to 2 months post-operatively  Identifies appropriate sources of fluids and proteins   States protein recommendations and appropriate sources post-operatively  Identifies the need for appropriate texture modifications, mastication, and bite sizes when consuming solids  Identifies appropriate multivitamin and calcium sources post-operatively  Describes the need for physical activity post-operatively and will follow MD recommendations  States when to call healthcare provider regarding medication questions or post-operative complications   Handouts given during class include:  Phase 3A: Soft, High Protein Diet Handout   Follow-Up Plan: Patient will follow-up at [facility] in 6 weeks for 2 month post-op nutrition visit for diet advancement per MD.

## 2019-10-28 ENCOUNTER — Telehealth: Payer: Self-pay | Admitting: Skilled Nursing Facility1

## 2019-10-28 NOTE — Telephone Encounter (Signed)
RD called pt to verify fluid intake once starting soft, solid proteins 2 week post-bariatric surgery.   Daily Fluid intake: 64+ Daily Protein intake: 80  Concerns/issues:   None reported.

## 2019-12-02 ENCOUNTER — Encounter: Payer: Managed Care, Other (non HMO) | Attending: General Surgery | Admitting: Skilled Nursing Facility1

## 2019-12-02 DIAGNOSIS — E662 Morbid (severe) obesity with alveolar hypoventilation: Secondary | ICD-10-CM | POA: Insufficient documentation

## 2019-12-02 DIAGNOSIS — Z6841 Body Mass Index (BMI) 40.0 and over, adult: Secondary | ICD-10-CM | POA: Insufficient documentation

## 2020-07-29 ENCOUNTER — Encounter (HOSPITAL_COMMUNITY): Payer: Self-pay

## 2021-05-04 ENCOUNTER — Encounter (HOSPITAL_COMMUNITY): Payer: Self-pay | Admitting: *Deleted

## 2022-05-05 ENCOUNTER — Encounter (HOSPITAL_COMMUNITY): Payer: Self-pay | Admitting: *Deleted

## 2023-05-11 ENCOUNTER — Encounter (HOSPITAL_COMMUNITY): Payer: Self-pay | Admitting: *Deleted

## 2024-05-09 ENCOUNTER — Encounter (HOSPITAL_COMMUNITY): Payer: Self-pay | Admitting: *Deleted
